# Patient Record
Sex: Female | Born: 1970
Health system: Southern US, Community
[De-identification: ages and names within clinical notes are randomized; demographics above are authoritative.]

## PROBLEM LIST (undated history)

## (undated) DIAGNOSIS — Z800 Family history of malignant neoplasm of digestive organs: Secondary | ICD-10-CM

## (undated) DIAGNOSIS — Z8042 Family history of malignant neoplasm of prostate: Secondary | ICD-10-CM

## (undated) DIAGNOSIS — Z803 Family history of malignant neoplasm of breast: Secondary | ICD-10-CM

## (undated) DIAGNOSIS — C801 Malignant (primary) neoplasm, unspecified: Secondary | ICD-10-CM

## (undated) DIAGNOSIS — K319 Disease of stomach and duodenum, unspecified: Secondary | ICD-10-CM

## (undated) HISTORY — PX: DILATION AND CURETTAGE OF UTERUS: SHX78

## (undated) HISTORY — DX: Family history of malignant neoplasm of prostate: Z80.42

## (undated) HISTORY — DX: Family history of malignant neoplasm of digestive organs: Z80.0

## (undated) HISTORY — DX: Family history of malignant neoplasm of breast: Z80.3

## (undated) HISTORY — PX: OTHER SURGICAL HISTORY: SHX169

---

## 2006-09-07 ENCOUNTER — Inpatient Hospital Stay (HOSPITAL_COMMUNITY): Admission: AD | Admit: 2006-09-07 | Discharge: 2006-09-09 | Payer: Self-pay | Admitting: Obstetrics and Gynecology

## 2006-09-14 ENCOUNTER — Encounter: Admission: RE | Admit: 2006-09-14 | Discharge: 2006-10-13 | Payer: Self-pay | Admitting: Obstetrics and Gynecology

## 2006-10-14 ENCOUNTER — Encounter: Admission: RE | Admit: 2006-10-14 | Discharge: 2006-11-13 | Payer: Self-pay | Admitting: Obstetrics and Gynecology

## 2006-11-14 ENCOUNTER — Encounter: Admission: RE | Admit: 2006-11-14 | Discharge: 2006-11-26 | Payer: Self-pay | Admitting: Obstetrics and Gynecology

## 2007-08-11 ENCOUNTER — Emergency Department (HOSPITAL_COMMUNITY): Admission: EM | Admit: 2007-08-11 | Discharge: 2007-08-11 | Payer: Self-pay | Admitting: Family Medicine

## 2007-09-02 ENCOUNTER — Ambulatory Visit: Payer: Self-pay | Admitting: Internal Medicine

## 2007-10-21 ENCOUNTER — Telehealth: Payer: Self-pay | Admitting: Internal Medicine

## 2008-03-09 ENCOUNTER — Telehealth: Payer: Self-pay | Admitting: Internal Medicine

## 2009-08-02 ENCOUNTER — Ambulatory Visit: Payer: Self-pay | Admitting: Internal Medicine

## 2009-08-02 DIAGNOSIS — L255 Unspecified contact dermatitis due to plants, except food: Secondary | ICD-10-CM

## 2009-08-02 DIAGNOSIS — J069 Acute upper respiratory infection, unspecified: Secondary | ICD-10-CM

## 2010-02-28 ENCOUNTER — Emergency Department (HOSPITAL_COMMUNITY): Admission: EM | Admit: 2010-02-28 | Discharge: 2009-10-03 | Payer: Self-pay | Admitting: Emergency Medicine

## 2010-04-17 ENCOUNTER — Ambulatory Visit
Admission: RE | Admit: 2010-04-17 | Discharge: 2010-04-17 | Payer: Self-pay | Source: Home / Self Care | Attending: Family Medicine | Admitting: Family Medicine

## 2010-04-17 DIAGNOSIS — J029 Acute pharyngitis, unspecified: Secondary | ICD-10-CM | POA: Insufficient documentation

## 2010-04-24 NOTE — Assessment & Plan Note (Signed)
Summary: cough/?poison oak/pt had tick on her 10 day ago/njr   Vital Signs:  Patient profile:   40 year old female Weight:      131 pounds Temp:     98.0 degrees F oral BP sitting:   100 / 60  (right arm) Cuff size:   regular  Vitals Entered By: Duard Brady LPN (Aug 02, 2009 10:13 AM) CC: cough, congestion, posion oak ? on arms and ear , also found embedded tick this weekend. Is Patient Diabetic? No   CC:  cough, congestion, posion oak ? on arms and ear , and also found embedded tick this weekend..  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  She also has a significant contact dermatitis involving the outer aspect of her left lower arm and scattered areas over her right ear and right upper arm.  She also had a tic  exposure  approximately 3 weeks ago.  Allergies (verified): 1)  ! * Sulfa? 2)  ! * Augmentin ?  Past History:  Past Medical History: Reviewed history from 09/02/2007 and no changes required. history of pyelonephritis gravida 6, para 3, abortus 3 family history of early breast cancer  Review of Systems       The patient complains of anorexia, hoarseness, prolonged cough, and suspicious skin lesions.  The patient denies fever, weight loss, weight gain, vision loss, decreased hearing, chest pain, syncope, dyspnea on exertion, peripheral edema, headaches, hemoptysis, abdominal pain, melena, hematochezia, severe indigestion/heartburn, hematuria, incontinence, genital sores, muscle weakness, transient blindness, difficulty walking, depression, unusual weight change, abnormal bleeding, enlarged lymph nodes, angioedema, and breast masses.    Physical Exam  General:  Well-developed,well-nourished,in no acute distress; alert,appropriate and cooperative throughout examination Head:  Normocephalic and atraumatic without obvious abnormalities. No apparent alopecia or  balding. Eyes:  No corneal or conjunctival inflammation noted. EOMI. Perrla. Funduscopic exam benign, without hemorrhages, exudates or papilledema. Vision grossly normal. Ears:  External ear exam shows no significant lesions or deformities.  Otoscopic examination reveals clear canals, tympanic membranes are intact bilaterally without bulging, retraction, inflammation or discharge. Hearing is grossly normal bilaterally. Mouth:  Oral mucosa and oropharynx without lesions or exudates.  Teeth in good repair. Neck:  No deformities, masses, or tenderness noted. Lungs:  Normal respiratory effort, chest expands symmetrically. Lungs are clear to auscultation, no crackles or wheezes. Heart:  Normal rate and regular rhythm. S1 and S2 normal without gallop, murmur, click, rub or other extra sounds. Skin:  significant dermatitis involving her left lower outer arm with erythema , vesiculation   Impression & Recommendations:  Problem # 1:  URI (ICD-465.9)  Problem # 2:  RHUS DERMATITIS (ICD-692.6)  Her updated medication list for this problem includes:    Triamcinolone Acetonide 0.5 % Crea (Triamcinolone acetonide) .Marland Kitchen... Apply twice daily  Her updated medication list for this problem includes:    Triamcinolone Acetonide 0.5 % Crea (Triamcinolone acetonide) .Marland Kitchen... Apply twice daily  Orders: Depo- Medrol 80mg  (J1040) Admin of Therapeutic Inj  intramuscular or subcutaneous (16109)  Problem # 3:  TICK BITE (ICD-E906.4) if patient develops fever.  Will treat with a 10 day course of doxycycline  Complete Medication List: 1)  Triamcinolone Acetonide 0.5 % Crea (Triamcinolone acetonide) .... Apply twice daily  Patient Instructions: 1)  Get plenty of rest, drink lots of clear liquids, and use Tylenol or Ibuprofen for fever and comfort. Return in 7-10 days if you're not better:sooner if you're  feeling worse. Prescriptions: TRIAMCINOLONE ACETONIDE 0.5 % CREA (TRIAMCINOLONE ACETONIDE) apply twice daily  #30  gm x 1   Entered and Authorized by:   Gordy Savers  MD   Signed by:   Gordy Savers  MD on 08/02/2009   Method used:   Print then Give to Patient   RxID:   [id number]    Medication Administration  Injection # 1:    Medication: Depo- Medrol 80mg     Diagnosis: RHUS DERMATITIS (ICD-692.6)    Route: IM    Site: RUOQ gluteus    Exp Date: 01/2012    Lot #: [id number]    Mfr: Pharmacia    Patient tolerated injection without complications    Given by: Duard Brady LPN (Aug 02, 2009 11:27 AM)  Orders Added: 1)  Est. Patient Level III [14782] 2)  Depo- Medrol 80mg  [J1040] 3)  Admin of Therapeutic Inj  intramuscular or subcutaneous [95621]

## 2010-04-25 NOTE — Assessment & Plan Note (Signed)
Summary: ? strep//ccm   Vital Signs:  Patient profile:   40 year old female Temp:     98.7 degrees F oral BP sitting:   80 / 60  (left arm) Cuff size:   regular  Vitals Entered By: Sid Falcon LPN (April 17, 2010 1:41 PM)   History of Present Illness: Patient seen with some nasal congestion and cough for the past few days. Last night developed sore throat. Was treated first of this month for positive strep screen with Zithromax. Felt better initially afterwards. No sick contacts. No rash. Denies nausea, vomiting, or diarrhea  Allergies: 1)  ! * Sulfa? 2)  ! * Augmentin ?  Past History:  Past Medical History: Last updated: 09/02/2007 history of pyelonephritis gravida 6, para 3, abortus 3 family history of early breast cancer PMH-FH-SH reviewed for relevance  Review of Systems      See HPI  Physical Exam  General:  Well-developed,well-nourished,in no acute distress; alert,appropriate and cooperative throughout examination Ears:  External ear exam shows no significant lesions or deformities.  Otoscopic examination reveals clear canals, tympanic membranes are intact bilaterally without bulging, retraction, inflammation or discharge. Hearing is grossly normal bilaterally. Mouth:  minimal erythema without exudate Neck:  supple with tender anterior cervical nodes right greater than left Lungs:  Normal respiratory effort, chest expands symmetrically. Lungs are clear to auscultation, no crackles or wheezes. Heart:  Normal rate and regular rhythm. S1 and S2 normal without gallop, murmur, click, rub or other extra sounds. Skin:  Intact without suspicious lesions or rashes   Impression & Recommendations:  Problem # 1:  ACUTE PHARYNGITIS (ICD-462)  check rapid strep. If negative treat symptomatically  Orders: Rapid Strep (16109)  Complete Medication List: 1)  Triamcinolone Acetonide 0.5 % Crea (Triamcinolone acetonide) .... Apply twice daily   Orders Added: 1)  Rapid  Strep [87880] 2)  Est. Patient Level III [60454]

## 2010-04-29 ENCOUNTER — Telehealth: Payer: Self-pay | Admitting: *Deleted

## 2010-04-29 NOTE — Telephone Encounter (Signed)
Follow up with primary if still swollen by end of week.

## 2010-04-29 NOTE — Telephone Encounter (Signed)
Pt wants to know her lymph nodes are still swollen, and if she needs to be concerned.

## 2010-04-29 NOTE — Telephone Encounter (Signed)
Left message on pt's personal voice mail per Dr. Lucie Leather recommendations.

## 2010-05-02 ENCOUNTER — Encounter: Payer: Self-pay | Admitting: Internal Medicine

## 2010-05-02 ENCOUNTER — Ambulatory Visit (INDEPENDENT_AMBULATORY_CARE_PROVIDER_SITE_OTHER): Payer: Managed Care, Other (non HMO) | Admitting: Internal Medicine

## 2010-05-02 DIAGNOSIS — R599 Enlarged lymph nodes, unspecified: Secondary | ICD-10-CM

## 2010-05-02 DIAGNOSIS — R590 Localized enlarged lymph nodes: Secondary | ICD-10-CM

## 2010-05-02 DIAGNOSIS — J029 Acute pharyngitis, unspecified: Secondary | ICD-10-CM

## 2010-05-02 NOTE — Progress Notes (Signed)
  Subjective:    Patient ID: Stephanie Johnson, female    DOB: May 12, 1970, 40 y.o.   MRN: [id number]  HPI  40 year old patient who has a history of recent documented streptococcal pharyngitis treated with azithromycin.  Her sore throat has resolved, but she has residual cervical adenopathy.  That caused her some concern.  She feels well without constitutional complaints.  No fever, sore throat has resolved.  She has noted.  No other adenopathy.    Review of Systems  Constitutional: Negative.   HENT: Negative for hearing loss, congestion, sore throat, rhinorrhea, dental problem, sinus pressure and tinnitus.   Eyes: Negative for pain, discharge and visual disturbance.  Respiratory: Negative for cough and shortness of breath.   Cardiovascular: Negative for chest pain, palpitations and leg swelling.  Gastrointestinal: Negative for nausea, vomiting, abdominal pain, diarrhea, constipation, blood in stool and abdominal distention.  Genitourinary: Negative for dysuria, urgency, frequency, hematuria, flank pain, vaginal bleeding, vaginal discharge, difficulty urinating, vaginal pain and pelvic pain.  Musculoskeletal: Negative for joint swelling, arthralgias and gait problem.  Skin: Negative for rash.  Neurological: Negative for dizziness, syncope, speech difficulty, weakness, numbness and headaches.  Hematological: Positive for adenopathy. Does not bruise/bleed easily.  Psychiatric/Behavioral: Negative for behavioral problems, dysphoric mood and agitation. The patient is not nervous/anxious.        Objective:   Physical Exam  Constitutional: She is oriented to person, place, and time. She appears well-developed and well-nourished.  HENT:  Head: Normocephalic and atraumatic.  Right Ear: External ear normal.  Left Ear: External ear normal.  Nose: Nose normal.  Mouth/Throat: Oropharynx is clear and moist.  Eyes: Conjunctivae and EOM are normal. Pupils are equal, round, and reactive to light.  Neck:  Normal range of motion. Neck supple. No thyromegaly present.       Patient had bilateral cervical adenopathy.  The right more prominent than the left.  The patient had no generalized lymphadenopathy  Cardiovascular: Normal rate, regular rhythm, normal heart sounds and intact distal pulses.   Pulmonary/Chest: Effort normal and breath sounds normal.  Abdominal: Soft. Bowel sounds are normal. She exhibits no mass. There is no tenderness.       No hepato- splenomegaly  Musculoskeletal: Normal range of motion.  Lymphadenopathy:    She has cervical adenopathy.  Neurological: She is alert and oriented to person, place, and time.  Skin: Skin is warm and dry. No rash noted.  Psychiatric: She has a normal mood and affect. Her behavior is normal.          Assessment & Plan:  Cervical lymphadenopathy.  This appears to be post inflammatory following recent group A beta-hemolytic strep.  Further antibiotic therapy not indicated.  She did have a office visit and negative Follow-up rapid strep recently.  She was reassured.  She will call if lymphadenopathy persists or worsens

## 2010-05-02 NOTE — Patient Instructions (Signed)
Call or return to clinic prn if these symptoms worsen or fail to improve as anticipated.

## 2010-06-26 ENCOUNTER — Telehealth: Payer: Self-pay | Admitting: *Deleted

## 2010-06-26 MED ORDER — CEPHALEXIN 500 MG PO CAPS: 500.0000 mg | ORAL_CAPSULE | Freq: Four times a day (QID) | ORAL | Status: AC

## 2010-06-26 NOTE — Telephone Encounter (Signed)
Cephalexin called in for pt's cat bite.

## 2010-06-26 NOTE — Telephone Encounter (Addendum)
Pt was bitten by a cat on her finger and needs antibiotics.  Augmentin upsets her stomach.  Has Cephexlexin in the office if she can start it.  Wants to start by lunch.  She is a Administrator, [employer].  Per Dr. Kirtland Bouchard :  Call in Cephalexin 500 mg. One po qid x 10 days.  Pt notified.

## 2010-08-06 NOTE — Discharge Summary (Signed)
NAMEJAMI, BOGDANSKI                  ACCOUNT NO.:  [id number]   MEDICAL RECORD NO.:  [id number]          PATIENT TYPE:  INP   LOCATION:  9109                          FACILITY:  [facility]   PHYSICIAN:  Zenaida Niece, M.D.DATE OF BIRTH:  Dec 23, 1970   DATE OF ADMISSION:  09/07/2006  DATE OF DISCHARGE:  09/09/2006                               DISCHARGE SUMMARY   ADMISSION DIAGNOSIS:  Intrauterine pregnancy at 37 weeks.  Group B strep  carrier.   DISCHARGE DIAGNOSIS:  Intrauterine pregnancy at 37 weeks.  Group B strep  carrier.   PROCEDURES:  On June 16, she had a spontaneous vaginal delivery.   HISTORY AND PHYSICAL:  This is a 40 year old white female gravida 6,  para 2-0-3-2 with an EGA of [redacted] weeks who presents with complaint of  ruptured membranes at 1600 on the day of admission.  This was confirmed  in the office and she at that point she was 2-3, 80,  -2, vertex present  presentation.  Prenatal care was essentially uncomplicated.   PRENATAL LABS:  Blood type is O+ with negative antibody screen, RPR  nonreactive, rubella immune, hepatitis B surface antigen negative,  gonorrhea and chlamydia negative, 1-hour Glucola 105 and group B strep  is positive.   PAST OB HISTORY:  Two vaginal deliveries at term 7 pounds 11 ounces, 6  pounds 4 ounces and the second delivery was quick. She has had three  spontaneous abortions.   PAST MEDICAL HISTORY:  Pyelonephritis.   PAST SURGICAL HISTORY:  D&C x2.   ALLERGIES:  AUGMENTIN has caused emesis and questionable reaction to  SULFA. She has tolerated IV PENICILLIN before.   PHYSICAL EXAM:  VITAL SIGNS:  She is afebrile with stable vital signs.  Fetal heart tracing is reactive with contractions every 5-8 minutes.  ABDOMEN:  Gravid and nontender with an estimated fetal weight of 7  pounds.  PELVIC:  Cervix is 2-3, 70, -2, vertex presentation, adequate pelvis.   HOSPITAL COURSE:  The patient was admitted and was given IV penicillin.  Her  contractions did not pick up so after she had received the  penicillin for at least an hour she was started on low-dose Pitocin.  Once she entered active labor, she progressed quickly to complete,  pushed well and on the evening of June 16 had a vaginal delivery of a  viable female infant with Apgars of 9 and 9, weight 6 pounds 2 ounces.  Placenta delivered spontaneous and intact.  It was sent for cord blood  collection.  She had a small first-degree laceration repaired with 3-0  Vicryl with local block.  Estimated blood loss was less than 500 mL.  Postpartum she had no significant complications.  Pre delivery  hemoglobin was 12, postdelivery is 11.  On postpartum day #2 she was  felt to be stable enough for discharge home.   DISCHARGE INSTRUCTIONS:  Regular diet, pelvic rest, follow-up in 6  weeks.  Medications are over-the-counter ibuprofen as needed and she is  given our discharge pamphlet.      Zenaida Niece, M.D.  Electronically Signed     TDM/MEDQ  D:  09/09/2006  T:  09/09/2006  Job:  [id number]

## 2011-01-08 LAB — CBC
HCT: 33.2 — ABNORMAL LOW
MCHC: 34.3
MCV: 91.6
Platelets: 148 — ABNORMAL LOW
Platelets: 179
RBC: 3.83 — ABNORMAL LOW
RDW: 14.6 — ABNORMAL HIGH

## 2011-01-08 LAB — RPR: RPR Ser Ql: NONREACTIVE

## 2012-05-11 ENCOUNTER — Ambulatory Visit (INDEPENDENT_AMBULATORY_CARE_PROVIDER_SITE_OTHER): Payer: BC Managed Care – PPO | Admitting: Family Medicine

## 2012-05-11 ENCOUNTER — Encounter: Payer: Self-pay | Admitting: Family Medicine

## 2012-05-11 VITALS — BP 120/74 | HR 108 | Temp 98.3°F | Wt 148.0 lb

## 2012-05-11 DIAGNOSIS — J329 Chronic sinusitis, unspecified: Secondary | ICD-10-CM

## 2012-05-11 DIAGNOSIS — J309 Allergic rhinitis, unspecified: Secondary | ICD-10-CM

## 2012-05-11 MED ORDER — FLUTICASONE PROPIONATE 50 MCG/ACT NA SUSP: 2.0000 | Freq: Every day | NASAL | Status: DC

## 2012-05-11 MED ORDER — AZITHROMYCIN 250 MG PO TABS: ORAL_TABLET | ORAL | Status: DC

## 2012-05-11 MED ORDER — HYDROCODONE-HOMATROPINE 5-1.5 MG/5ML PO SYRP: 5.0000 mL | ORAL_SOLUTION | Freq: Every evening | ORAL | Status: DC | PRN

## 2012-05-11 NOTE — Progress Notes (Signed)
Chief Complaint  Patient presents with  . Cough    hurting in chest and throat; keeps pt up at night     HPI:  -started: started with a stomach bug and a cold for a few weeks, but that cleared up but cough has lingered -symptoms:nasal congestion, sore throat, drainage - these have resolved but has lingering cough and drainage, has had some sinus congestion -denies:fever, SOB, NVD, tooth pain  -has tried: nothing -sick contacts: family a few weeks ago   Hx of: AR -nasal symptoms, PND and sneezing for several months every spring -syrtec helps  ROS: See pertinent positives and negatives per HPI.  No past medical history on file.  Family History  Problem Relation Age of Onset  . Cancer Mother 36    stage IV  . Cancer Father     prostate  . Hypertension Father   . Stroke Father   . Migraines Brother   . Cancer Maternal Grandmother     breast   . Heart failure Paternal Grandfather     congestive    History   Social History  . Marital Status: Married    Spouse Name: N/A    Number of Children: N/A  . Years of Education: N/A   Social History Main Topics  . Smoking status: Never Smoker   . Smokeless tobacco: None  . Alcohol Use: None  . Drug Use: None  . Sexually Active: None   Other Topics Concern  . None   Social History Narrative  . None    Current outpatient prescriptions:azithromycin (ZITHROMAX) 250 MG tablet, 2 tabs on the fist day, 1 tab daily for 4 days, Disp: 6 tablet, Rfl: 0;  fluticasone (FLONASE) 50 MCG/ACT nasal spray, Place 2 sprays into the nose daily., Disp: 16 g, Rfl: 2;  HYDROcodone-homatropine (HYCODAN) 5-1.5 MG/5ML syrup, Take 5 mLs by mouth at bedtime as needed for cough., Disp: 120 mL, Rfl: 0 triamcinolone (KENALOG) 0.5 % cream, Apply topically 2 (two) times daily.  , Disp: , Rfl:   EXAM:  Filed Vitals:   05/11/12 1030  BP: 120/74  Pulse: 108  Temp: 98.3 F (36.8 C)    Body mass index is 25.8 kg/(m^2).  GENERAL: vitals reviewed  and listed above, alert, oriented, appears well hydrated and in no acute distress  HEENT: atraumatic, conjunttiva clear, no obvious abnormalities on inspection of external nose and ears, normal appearance of ear canals and TMs, white nasal congestion L>R, mild post oropharyngeal erythema with PND, no tonsillar edema or exudate, no sinus TTP  NECK: no obvious masses on inspection  LUNGS: clear to auscultation bilaterally, no wheezes, rales or rhonchi, good air movement  CV: HRRR, no peripheral edema  MS: moves all extremities without noticeable abnormality  PSYCH: pleasant and cooperative, no obvious depression or anxiety  ASSESSMENT AND PLAN:  Discussed the following assessment and plan:  Sinusitis - Plan: azithromycin (ZITHROMAX) 250 MG tablet, HYDROcodone-homatropine (HYCODAN) 5-1.5 MG/5ML syrup  Allergic rhinitis - Plan: fluticasone (FLONASE) 50 MCG/ACT nasal spray  -per exam and length of symptoms abx and cough medication provided - risks discussed.  -for what sounds like seasonal AR - advised flonase and addition of zyrtec if needed -Patient advised to return or notify a doctor immediately if symptoms worsen or persist or new concerns arise.  Patient Instructions  INSTRUCTIONS FOR UPPER RESPIRATORY INFECTION:  -As we discussed, we have prescribed new medications (AZITHROMYCIN, FLONASE and HYCODAN) for you at this appointment. We discussed the common and serious  potential adverse effects of this medication and you can review these and more with the pharmacist when you pick up your medication.  Please follow the instructions for use carefully and notify us immediately if you have any problems taking this medication.   -plenty of rest and fluids  -nasal saline wash 2-3 times daily (use prepackaged nasal saline or bottled/distilled water if making your own)   -can use tylenol or ibuprofen as directed for aches and sorethroat  -in the winter time, using a humidifier at night is  helpful (please follow cleaning instructions)  -if you are taking a cough medication - use only as directed, may also try a teaspoon of honey to coat the throat and throat lozenges  -for sore throat, salt water gargles can help  -follow up if you have fevers, facial pain, tooth pain, difficulty breathing or are worsening or not getting better in 5-7 days      Alvia Jablonski R.

## 2012-05-11 NOTE — Patient Instructions (Addendum)
INSTRUCTIONS FOR UPPER RESPIRATORY INFECTION:  -As we discussed, we have prescribed new medications (AZITHROMYCIN, FLONASE and HYCODAN) for you at this appointment. We discussed the common and serious potential adverse effects of this medication and you can review these and more with the pharmacist when you pick up your medication.  Please follow the instructions for use carefully and notify us immediately if you have any problems taking this medication.   -plenty of rest and fluids  -nasal saline wash 2-3 times daily (use prepackaged nasal saline or bottled/distilled water if making your own)   -can use tylenol or ibuprofen as directed for aches and sorethroat  -in the winter time, using a humidifier at night is helpful (please follow cleaning instructions)  -if you are taking a cough medication - use only as directed, may also try a teaspoon of honey to coat the throat and throat lozenges  -for sore throat, salt water gargles can help  -follow up if you have fevers, facial pain, tooth pain, difficulty breathing or are worsening or not getting better in 5-7 days

## 2013-02-01 ENCOUNTER — Encounter (HOSPITAL_COMMUNITY): Payer: Self-pay | Admitting: *Deleted

## 2013-03-07 ENCOUNTER — Encounter (HOSPITAL_COMMUNITY): Payer: Self-pay | Admitting: Pharmacist

## 2013-03-08 ENCOUNTER — Ambulatory Visit (HOSPITAL_COMMUNITY)
Admission: RE | Admit: 2013-03-08 | Discharge: 2013-03-08 | Disposition: A | Discharge status: Home / Self Care | Payer: BC Managed Care – PPO | Source: Ambulatory Visit | Attending: Obstetrics and Gynecology | Admitting: Obstetrics and Gynecology

## 2013-03-08 ENCOUNTER — Encounter (HOSPITAL_COMMUNITY): Payer: Self-pay | Admitting: Anesthesiology

## 2013-03-08 ENCOUNTER — Ambulatory Visit (HOSPITAL_COMMUNITY): Payer: BC Managed Care – PPO | Admitting: Anesthesiology

## 2013-03-08 ENCOUNTER — Encounter (HOSPITAL_COMMUNITY)
Admission: RE | Disposition: A | Discharge status: Home / Self Care | Payer: Self-pay | Source: Ambulatory Visit | Attending: Obstetrics and Gynecology

## 2013-03-08 ENCOUNTER — Encounter (HOSPITAL_COMMUNITY): Payer: BC Managed Care – PPO | Admitting: Anesthesiology

## 2013-03-08 DIAGNOSIS — D069 Carcinoma in situ of cervix, unspecified: Secondary | ICD-10-CM | POA: Diagnosis present

## 2013-03-08 HISTORY — PX: CERVICAL CONIZATION W/BX: SHX1330

## 2013-03-08 HISTORY — DX: Carcinoma in situ of cervix, unspecified: D06.9

## 2013-03-08 LAB — CBC
HCT: 37.6 % (ref 36.0–46.0)
MCH: 30.5 pg (ref 26.0–34.0)
MCHC: 34 g/dL (ref 30.0–36.0)
RDW: 12.6 % (ref 11.5–15.5)

## 2013-03-08 LAB — PREGNANCY, URINE: Preg Test, Ur: NEGATIVE

## 2013-03-08 SURGERY — CONE BIOPSY, CERVIX
Anesthesia: General | Site: Vagina

## 2013-03-08 MED ORDER — LIDOCAINE HCL (CARDIAC) 20 MG/ML IV SOLN
INTRAVENOUS | Status: DC | PRN
  Administered 2013-03-08: 30 mg via INTRAVENOUS

## 2013-03-08 MED ORDER — ONDANSETRON HCL 4 MG/2ML IJ SOLN
INTRAMUSCULAR | Status: AC
  Filled 2013-03-08: qty 2

## 2013-03-08 MED ORDER — KETOROLAC TROMETHAMINE 30 MG/ML IJ SOLN
INTRAMUSCULAR | Status: AC
  Filled 2013-03-08: qty 1

## 2013-03-08 MED ORDER — FLUMAZENIL 0.5 MG/5ML IV SOLN
INTRAVENOUS | Status: DC | PRN
  Administered 2013-03-08: .15 mg via INTRAVENOUS

## 2013-03-08 MED ORDER — MIDAZOLAM HCL 2 MG/2ML IJ SOLN
INTRAMUSCULAR | Status: AC
  Filled 2013-03-08: qty 2

## 2013-03-08 MED ORDER — IODINE STRONG (LUGOLS) 5 % PO SOLN
ORAL | Status: AC
  Filled 2013-03-08: qty 1

## 2013-03-08 MED ORDER — HYDROCODONE-ACETAMINOPHEN 5-325 MG PO TABS: 1.0000 | ORAL_TABLET | Freq: Four times a day (QID) | ORAL | Status: DC | PRN

## 2013-03-08 MED ORDER — LIDOCAINE HCL 1 % IJ SOLN
INTRAMUSCULAR | Status: AC
  Filled 2013-03-08: qty 20

## 2013-03-08 MED ORDER — ACETAMINOPHEN 160 MG/5ML PO SOLN
1000.0000 mg | Freq: Once | ORAL | Status: AC
  Administered 2013-03-08: 1000 mg via ORAL

## 2013-03-08 MED ORDER — FLUMAZENIL 0.5 MG/5ML IV SOLN
INTRAVENOUS | Status: AC
  Filled 2013-03-08: qty 5

## 2013-03-08 MED ORDER — PROPOFOL 10 MG/ML IV BOLUS
INTRAVENOUS | Status: DC | PRN
  Administered 2013-03-08: 150 mg via INTRAVENOUS

## 2013-03-08 MED ORDER — FENTANYL CITRATE 0.05 MG/ML IJ SOLN: 25.0000 ug | INTRAMUSCULAR | Status: DC | PRN

## 2013-03-08 MED ORDER — LACTATED RINGERS IV SOLN
INTRAVENOUS | Status: DC
  Administered 2013-03-08: 11:00:00 via INTRAVENOUS

## 2013-03-08 MED ORDER — ARTIFICIAL TEARS OP OINT
TOPICAL_OINTMENT | OPHTHALMIC | Status: AC
  Filled 2013-03-08: qty 3.5

## 2013-03-08 MED ORDER — ONDANSETRON HCL 4 MG/2ML IJ SOLN
INTRAMUSCULAR | Status: DC | PRN
  Administered 2013-03-08: 4 mg via INTRAVENOUS

## 2013-03-08 MED ORDER — LACTATED RINGERS IV SOLN: INTRAVENOUS | Status: DC

## 2013-03-08 MED ORDER — LIDOCAINE HCL 1 % IJ SOLN
INTRAMUSCULAR | Status: DC | PRN
  Administered 2013-03-08: 16 mL

## 2013-03-08 MED ORDER — LIDOCAINE HCL (CARDIAC) 20 MG/ML IV SOLN
INTRAVENOUS | Status: AC
  Filled 2013-03-08: qty 5

## 2013-03-08 MED ORDER — FENTANYL CITRATE 0.05 MG/ML IJ SOLN
INTRAMUSCULAR | Status: DC | PRN
  Administered 2013-03-08: 50 ug via INTRAVENOUS

## 2013-03-08 MED ORDER — ACETAMINOPHEN 160 MG/5ML PO SOLN
ORAL | Status: AC
  Administered 2013-03-08: 1000 mg via ORAL
  Filled 2013-03-08: qty 40.6

## 2013-03-08 MED ORDER — ACETIC ACID 5 % SOLN
Status: AC
  Filled 2013-03-08: qty 500

## 2013-03-08 MED ORDER — KETOROLAC TROMETHAMINE 30 MG/ML IJ SOLN
INTRAMUSCULAR | Status: DC | PRN
  Administered 2013-03-08: 30 mg via INTRAVENOUS

## 2013-03-08 MED ORDER — PROPOFOL 10 MG/ML IV EMUL
INTRAVENOUS | Status: AC
  Filled 2013-03-08: qty 20

## 2013-03-08 MED ORDER — FERRIC SUBSULFATE 259 MG/GM EX SOLN
CUTANEOUS | Status: AC
  Filled 2013-03-08: qty 8

## 2013-03-08 MED ORDER — FENTANYL CITRATE 0.05 MG/ML IJ SOLN
INTRAMUSCULAR | Status: AC
  Filled 2013-03-08: qty 2

## 2013-03-08 MED ORDER — MIDAZOLAM HCL 2 MG/2ML IJ SOLN
INTRAMUSCULAR | Status: DC | PRN
  Administered 2013-03-08: 2 mg via INTRAVENOUS

## 2013-03-08 MED ORDER — IODINE STRONG (LUGOLS) 5 % PO SOLN
ORAL | Status: DC | PRN
  Administered 2013-03-08: 0.2 mL

## 2013-03-08 SURGICAL SUPPLY — 39 items
APPLICATOR COTTON TIP 6IN STRL (MISCELLANEOUS) ×2 IMPLANT
BLADE SURG 15 STRL LF C SS BP (BLADE) ×1 IMPLANT
BLADE SURG 15 STRL SS (BLADE) ×2
CATH ROBINSON RED A/P 16FR (CATHETERS) ×2 IMPLANT
CLOTH BEACON ORANGE TIMEOUT ST (SAFETY) ×2 IMPLANT
CONTAINER PREFILL 10% NBF 60ML (FORM) ×2 IMPLANT
COUNTER NEEDLE 1200 MAGNETIC (NEEDLE) ×2 IMPLANT
DECANTER SPIKE VIAL GLASS SM (MISCELLANEOUS) ×2 IMPLANT
DRSG TELFA 3X8 NADH (GAUZE/BANDAGES/DRESSINGS) ×2 IMPLANT
ELECT BALL LEEP 5MM RED (ELECTRODE) ×2 IMPLANT
ELECT REM PT RETURN 9FT ADLT (ELECTROSURGICAL) ×2
ELECTRODE REM PT RTRN 9FT ADLT (ELECTROSURGICAL) ×1 IMPLANT
GAUZE SPONGE 4X4 16PLY XRAY LF (GAUZE/BANDAGES/DRESSINGS) IMPLANT
GLOVE BIO SURGEON STRL SZ8 (GLOVE) ×2 IMPLANT
GLOVE ORTHO TXT STRL SZ7.5 (GLOVE) ×2 IMPLANT
GOWN STRL REIN XL XLG (GOWN DISPOSABLE) ×4 IMPLANT
HEMOSTAT SURGICEL 2X14 (HEMOSTASIS) IMPLANT
HEMOSTAT SURGICEL 2X3 (HEMOSTASIS) ×1 IMPLANT
HOSE NS SMOKE EVAC 7/8 X6 (MISCELLANEOUS) ×2 IMPLANT
NDL SPNL 22GX3.5 QUINCKE BK (NEEDLE) ×1 IMPLANT
NEEDLE SPNL 22GX3.5 QUINCKE BK (NEEDLE) ×2 IMPLANT
NS IRRIG 1000ML POUR BTL (IV SOLUTION) ×2 IMPLANT
PACK VAGINAL MINOR WOMEN LF (CUSTOM PROCEDURE TRAY) ×2 IMPLANT
PAD DRESSING TELFA 3X8 NADH (GAUZE/BANDAGES/DRESSINGS) ×1 IMPLANT
PAD OB MATERNITY 4.3X12.25 (PERSONAL CARE ITEMS) ×2 IMPLANT
PENCIL BUTTON HOLSTER BLD 10FT (ELECTRODE) ×2 IMPLANT
REDUCER FITTING SMOKE EVAC (MISCELLANEOUS) ×2 IMPLANT
SCOPETTES 8  STERILE (MISCELLANEOUS) ×2
SCOPETTES 8 STERILE (MISCELLANEOUS) ×2 IMPLANT
SPONGE SURGIFOAM ABS GEL 12-7 (HEMOSTASIS) IMPLANT
SUT CHROMIC 1 CT1 27 (SUTURE) ×4 IMPLANT
SYR 20CC LL (SYRINGE) ×2 IMPLANT
SYR CONTROL 10ML LL (SYRINGE) ×2 IMPLANT
SYR TB 1ML 27GX1/2 SAFE (SYRINGE) ×1 IMPLANT
SYR TB 1ML 27GX1/2 SAFETY (SYRINGE) ×2
TOWEL OR 17X24 6PK STRL BLUE (TOWEL DISPOSABLE) ×4 IMPLANT
TUBING NON-CON 1/4 X 20 CONN (TUBING) ×2 IMPLANT
WATER STERILE IRR 1000ML POUR (IV SOLUTION) ×2 IMPLANT
YANKAUER SUCT BULB TIP NO VENT (SUCTIONS) ×2 IMPLANT

## 2013-03-08 NOTE — Interval H&P Note (Signed)
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 After consideration of risks, benefits and other options for treatment, the patient has consented to  Procedure(s) with comments: CONIZATION CERVIX WITH BIOPSY, cold knife cone biopsy  (N/A) - 1hr OR time as a surgical intervention .  The patient's history has been reviewed, patient examined, no change in status, stable for surgery.  I have reviewed the patient's chart and labs.  Questions were answered to the patient's satisfaction.     Stephanie Johnson D

## 2013-03-08 NOTE — Op Note (Signed)
Preoperative diagnosis: CIN-3 Postoperative diagnosis: Same Procedure: Cold knife cone biopsy Surgeon: Lavina Hamman M.D. Anesthesia: General with LMA Estimated blood loss: 50 cc Findings: She had a well-healed LEEP bed, the entire ectocervix stained with Lugol's solution Complications: None Specimens: Cone biopsy   Procedure in detail:  The patient was taken to the operating room placed in the dorsosupine position.  General anesthesia was induced and she was placed in mobile stirrups. Perineum and vagina were then prepped and draped in the usual sterile fashion and the legs were elevated in stirrups. Bladder was drained with a red Robinson catheter. A weighted speculum was inserted into the vagina and Deaver retractors used anteriorly and laterally. Stay sutures of #0 chromic were placed at 3 and 9:00. Deep paracervical block was then performed with a total of 16 cc of 1% plain lidocaine. The cervix was then stained with Lugol's solution and there were no nonstaining areas. The previous LEEP bed was well demarcated. I then used a scalpel to remove a cone biopsy specimen out well lateral of any ectocervical margins and took a deep endocervical margin. The specimen was removed intact, keeping her IUD strings intact. Bleeding was controlled with electrocautery. The cone bed was then packed with Surgicel and the stay sutures were tied across the cervix. No significant bleeding was noted. All instruments were removed from the vagina. The patient was taken down from stirrups and taken to recovery in stable condition. Counts were correct and she had PAS hose on throughout the procedure.

## 2013-03-08 NOTE — Anesthesia Preprocedure Evaluation (Signed)
Anesthesia Evaluation  Patient identified by MRN, date of birth, ID band Patient awake    Reviewed: Allergy & Precautions, H&P , Patient's Chart, lab work & pertinent test results, reviewed documented beta blocker date and time   Airway Mallampati: II TM Distance: >3 FB Neck ROM: full    Dental no notable dental hx.    Pulmonary  breath sounds clear to auscultation  Pulmonary exam normal       Cardiovascular Rhythm:regular Rate:Normal     Neuro/Psych    GI/Hepatic   Endo/Other    Renal/GU      Musculoskeletal   Abdominal   Peds  Hematology   Anesthesia Other Findings   Reproductive/Obstetrics                           Anesthesia Physical Anesthesia Plan  ASA: II  Anesthesia Plan:    Post-op Pain Management:    Induction: Intravenous  Airway Management Planned: LMA  Additional Equipment:   Intra-op Plan:   Post-operative Plan:   Informed Consent: I have reviewed the patients History and Physical, chart, labs and discussed the procedure including the risks, benefits and alternatives for the proposed anesthesia with the patient or authorized representative who has indicated his/her understanding and acceptance.   Dental Advisory Given and Dental advisory given  Plan Discussed with: CRNA and Surgeon  Anesthesia Plan Comments:         Anesthesia Quick Evaluation  

## 2013-03-08 NOTE — Anesthesia Postprocedure Evaluation (Signed)
  Anesthesia Post-op Note  Patient: Stephanie Johnson  Procedure(s) Performed: Procedure(s) with comments: CONIZATION CERVIX WITH BIOPSY  (N/A) - 1hr OR time Patient is awake and responsive. Pain and nausea are reasonably well controlled. Vital signs are stable and clinically acceptable. Oxygen saturation is clinically acceptable. There are no apparent anesthetic complications at this time. Patient is ready for discharge.

## 2013-03-08 NOTE — H&P (Signed)
Stephanie Johnson is an 42 y.o. female. 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  She is admitted for cone biopsy for further treatment.  Pertinent Gynecological History: OB History: G6, P3033   Menstrual History: No LMP recorded. Patient has had an implant.    Past Medical History  Diagnosis Date  . Medical history non-contributory   Pyelonephritis  Past Surgical History  Procedure Laterality Date  . Dilation and curettage of uterus      Family History  Problem Relation Age of Onset  . Cancer Mother 49    stage IV  . Cancer Father     prostate  . Hypertension Father   . Stroke Father   . Migraines Brother   . Cancer Maternal Grandmother     breast   . Heart failure Paternal Grandfather     congestive    Social History:  reports that she has never smoked. She does not have any smokeless tobacco history on file. Her alcohol and drug histories are not on file.  Allergies:  Allergies  Allergen Reactions  . Augmentin [Amoxicillin-Pot Clavulanate] Nausea And Vomiting    Can take Keflex    No prescriptions prior to admission    Review of Systems  Respiratory: Negative.   Cardiovascular: Negative.   Gastrointestinal: Negative.   Genitourinary: Negative.     There were no vitals taken for this visit. Physical Exam  Constitutional: She appears well-developed and well-nourished.  Neck: Neck supple. No thyromegaly present.  Cardiovascular: Normal rate, regular rhythm and normal heart sounds.   No murmur heard. Respiratory: Effort normal and breath sounds normal. No respiratory distress. She has no wheezes.  GI: Soft. She exhibits no distension and no mass. There is no tenderness.  Genitourinary: Vagina normal and uterus normal.  No adnexal mass Cervix well healed from LEEP    No results found for this or any previous visit (from the past 24 hour(s)).  No results  found.  Assessment/Plan: CIN III with + margins on LEEP admitted for cone biopsy fur further treatment  Stephanie Johnson D 03/08/2013, 8:07 AM

## 2013-03-08 NOTE — Transfer of Care (Signed)
Immediate Anesthesia Transfer of Care Note  Patient: Stephanie Johnson  Procedure(s) Performed: Procedure(s) with comments: CONIZATION CERVIX WITH BIOPSY  (N/A) - 1hr OR time  Patient Location: PACU  Anesthesia Type:General  Level of Consciousness: awake, oriented, sedated and patient cooperative  Airway & Oxygen Therapy: Patient Spontanous Breathing and Patient connected to nasal cannula oxygen  Post-op Assessment: Report given to PACU RN and Post -op Vital signs reviewed and stable  Post vital signs: Reviewed and stable  Complications: No apparent anesthesia complications

## 2013-03-09 ENCOUNTER — Encounter (HOSPITAL_COMMUNITY): Payer: Self-pay | Admitting: Obstetrics and Gynecology

## 2013-03-29 ENCOUNTER — Encounter: Payer: Self-pay | Admitting: Gynecologic Oncology

## 2013-03-29 ENCOUNTER — Ambulatory Visit: Payer: BC Managed Care – PPO | Attending: Gynecologic Oncology | Admitting: Gynecologic Oncology

## 2013-03-29 VITALS — BP 116/63 | HR 92 | Temp 98.0°F | Resp 20 | Ht 63.78 in | Wt 147.7 lb

## 2013-03-29 DIAGNOSIS — C539 Malignant neoplasm of cervix uteri, unspecified: Secondary | ICD-10-CM | POA: Insufficient documentation

## 2013-03-29 DIAGNOSIS — Z803 Family history of malignant neoplasm of breast: Secondary | ICD-10-CM | POA: Insufficient documentation

## 2013-03-29 DIAGNOSIS — D069 Carcinoma in situ of cervix, unspecified: Secondary | ICD-10-CM

## 2013-03-29 NOTE — Patient Instructions (Signed)
Cervical Cancer The cervix is the opening and bottom part of the uterus between the vagina and the uterus. Cervical cancer is a fairly common cancer. It occurs most often in women between the ages of 40 years and 55 years. Cells of the cervix act very much like skin cells. These cells are exposed to toxins, viruses, and bacteria that may cause abnormal changes.  There are two kinds of cancers of the cervix:   Squamous cell carcinoma This type of cancer starts in the flat or scale-like cells that line the cervix. Squamous cell carcinoma can develop from a sexually transmitted infection caused by the human papillomavirus (HPV).   Adenocarcinoma This type of cervical cancer starts in glandular cells that line the cervix. RISK FACTORS The risk of getting cancer of the cervix is related to your lifestyle, sexual history, health, and immune system. Risks for cervical cancer include:   Having a sexually transmitted viral infection. These include:  Chlamydia.   Herpes.   HPV.  Becoming sexually active before age 18 years.   Having more than one sexual partner or having sex with someone who has more than one sexual partner.   Not using condoms with sexual partners.   Having had cancer of the vagina or vulva.   Having a sexual partner who has or had cancer of the penis or who has had a sexual partner with cervical dysplasia or cervical cancer.   Using oral contraceptives (also called birth control pills).  Smoking.   Having a weakened immune system. For example, human immunodeficiency virus (HIV) or other immune deficiency disorders.   Being the daughter of a woman who took diethylstilbestrol (DES) during pregnancy.   Having a sister or mother who has had cancer of the cervix.   Being African American, Hispanic, Asian, or a woman from the [place].   A history of dysplasia of the cervix. SIGNS AND SYMPTOMS  Symptoms are usually not present in the early stages of  cervical cancer. Once the cancer invades the cervix and surrounding tissues, the woman may have:   Abnormal vaginal bleeding or menstrual bleeding that is longer or heavier than usual.   Bleeding after intercourse, douching, or a Pap test.   Vaginal bleeding following menopause.   Abnormal vaginal discharge.  Pelvic discomfort or pain.  An abnormal Pap test.  Pain during sexual intercourse. Symptoms of more advanced cervical cancer may include:   Loss of appetite or weight loss.   Tiredness (fatigue).   Back and leg pain.   Inability to control urination or bowel movements. DIAGNOSIS  A pelvic exam and Pap test are done to diagnose the condition. If abnormalities are found during the exam or Pap test, the Pap test may be repeated in 3 months, or your health care provider may do additional tests or procedures, such as:   A colposcopy This is a procedure that uses a special microscope that allows the health care provider to magnify and closely examine the cells of the cervix, vagina, and vulva.   Cervical biopsies This is a procedure where small tissue samples are taken from the cervix to be examined under a microscope by a specialist.   A cone biopsy This is a procedure to test for or remove cancerous tissue.  Other tests may be needed, including:   Cystoscopy.   Proctoscopy or sigmoidoscopy.   Ultrasound.   CT scan.   MRI.   Laparoscopy.  There are different stages of cervical cancer:   Stage   0, carcinoma in situ (CIS) This first stage of cancer is the last and most serious stage of dysplasia.   Stage 1 This means the tumor is in the uterus and cervix only.   Stage 2 This means the tumor has spread to the upper vagina. The cancer has spread beyond the uterus, but not to the pelvic walls or lower third of the vagina.   Stage 3 This means the tumor has invaded the side wall of the pelvis and the lower third of the vagina. Blockage of the tubes  that carry urine (ureters) from the tumor may cause urine to back up and cause the kidneys to swell (hydronephrosis).   Stage 4 This means the tumor has spread to the rectum or bladder. In the later part of this stage, it has also spread to distant organs, like the lungs.  TREATMENT  Treatment options can include:   Cone biopsy to remove the cancerous tissue.   Removal of the entire uterus and cervix.   Removal of the uterus, cervix, upper vagina, lymph nodes, and surrounding tissue (modified radical hysterectomy). The ovaries may be left in place or removed.   Medicines to treat cancer.   A combination of surgery, radiation, and chemotherapy.   Biological response modifiers. These are substances that help strengthen your immune system's fight against cancer or infection. They may be used in combination with chemotherapy.  HOME CARE INSTRUCTIONS   Get a gynecology exam and Pap test once every year or as directed by your health care provider.   Get the HPV vaccine.   Do not smoke.  Do not have sexual intercourse until your health care provider says it is okay.  Use a condom every time you have sex. SEEK MEDICAL CARE IF:   You have increased pelvic pain or pressure.   Your are becoming increasingly tired.   You have increased leg or back pain.   You have a fever.  You have abnormal bleeding or discharge.  You lose weight. SEEK IMMEDIATE MEDICAL CARE IF:   You cannot urinate.  You have blood in your urine.   You have blood or pressure with a bowel movement.   You develop severe back, stomach, or pelvic pain. Document Released: 03/10/2005 Document Revised: 11/10/2012 Document Reviewed: 09/01/2012 Exit[employer] Patient Information 2014 [employer].  

## 2013-03-29 NOTE — Progress Notes (Signed)
Consult Note: Gyn-Onc  Stephanie Johnson 42 y.o. female  CC:  Chief Complaint  Patient presents with  . Cervical Cancer    HPI: Patient is seen today in consultation at the request of Dr. Todd Meisinger.  Patient is a 42-year-old gravida 6 aborta 3 who had a Pap smear that was normal 2 years ago but she did have high risk HPV. She went to see her gynecologist for her annual Pap smear. In September, her Pap smear revealed CIN-3 with severe dysplasia. She underwent a LEEP October 28. It revealed CIN-3 with a positive margin. They discussed proceeding with a cold knife conization for definitive treatment and diagnosis. She underwent a cold knife conization on December 16 that revealed: Diagnosis Cervix, cone - INVASIVE ENDOCERVICAL TYPE ADENOCARCINOMA ARISING IN A BACKGROUND OF ENDOCERVICAL ADENOCARCINOMA IN SITU, SEE COMMENT. - HIGH GRADE SQUAMOUS INTRAEPITHELIAL LESION, CIN-III (SEVERE DYSPLASIA/CIS) WITH  ENDOCERVICAL GLANDULAR EXTENSION - IN SITU ADENOCARCINOMA PRESENT AT ENDOCERVICAL MARGIN. - ADENOCARCINOMA IS LESS THAN 1MM FROM DEEP EXCISIONAL MARGIN.  She comes in with her husband today for discussion and evaluation. She stay she's never had a prior abnormal Pap smear. She is a Mirena IUD for contraception. She has had some irregular bleeding which has been fairly random. She's not had significant postcoital bleeding but occasionally she'll have some spotting which she believes is due to small external tears. She's had 3 vaginal deliveries and stay she's with had perineal laceration and continues to have small lacerations with intercourse. She denies any change in her bowel or bladder habits. She doesn't wish is a component of irritable bowel syndrome and is never been fully worked up. The significant family history of breast cancer in her maternal grandmother as well as her mother. Her maternal grandmother was diagnosed with breast cancer in her 60s and had recurrence in her 80s. Her mother was  diagnosed with breast cancer 40s. She recurred approximately 10 years later she subsequently passed away 2011 at the age of 70 from recurrent breast cancer. The patient has been tested and is BRCA negative. She's not yet had a mammogram. She has nursed her children individually for 2-1/2-3 years each.  Review of Systems:  Constitutional: Denies fever. Skin: No rash, sores, jaundice, itching, or dryness.  Cardiovascular: No chest pain, shortness of breath, or edema  Pulmonary: No cough or wheeze.  Gastro Intestinal: No nausea, vomiting, constipation, or diarrhea reported. No bright red blood per rectum or change in bowel movement.  Genitourinary: No frequency, urgency, or dysuria.  Denies vaginal bleeding and discharge.  Musculoskeletal: No myalgia, arthralgia, joint swelling or pain.  Neurologic: No weakness, numbness, or change in gait.  Psychology: No changes   Current Meds:  Outpatient Encounter Prescriptions as of 03/29/2013  Medication Sig  . diphenoxylate-atropine (LOMOTIL) 2.5-0.025 MG per tablet Take 2 tablets by mouth 4 (four) times daily as needed for diarrhea or loose stools.  . HYDROcodone-acetaminophen (NORCO) 5-325 MG per tablet Take 1-2 tablets by mouth every 6 (six) hours as needed for moderate pain.    Allergy:  Allergies  Allergen Reactions  . Sulfa Antibiotics Anaphylaxis    Vomiting  . Augmentin [Amoxicillin-Pot Clavulanate] Nausea And Vomiting    Can take Keflex    Social Hx:   History   Social History  . Marital Status: Married    Spouse Name: N/A    Number of Children: N/A  . Years of Education: N/A   Occupational History  . Not on file.   Social History Main   Topics  . Smoking status: Never Smoker   . Smokeless tobacco: Not on file  . Alcohol Use: Not on file  . Drug Use: Not on file  . Sexual Activity: Not on file   Other Topics Concern  . Not on file   Social History Narrative  . No narrative on file    Past Surgical Hx:  Past Surgical  History  Procedure Laterality Date  . Dilation and curettage of uterus    . Cervical conization w/bx N/A 03/08/2013    Procedure: CONIZATION CERVIX WITH BIOPSY ;  Surgeon: Todd Meisinger, MD;  Location: [facility];  Service: Gynecology;  Laterality: N/A;  1hr OR time    Past Medical Hx:  Past Medical History  Diagnosis Date  . Medical history non-contributory     Oncology Hx:   No history exists.    Family Hx:  Family History  Problem Relation Age of Onset  . Cancer Mother 45    stage IV  . Cancer Father     prostate  . Hypertension Father   . Stroke Father   . Migraines Brother   . Cancer Brother 40    prostate  . Cancer Maternal Grandmother     breast   . Heart failure Paternal Grandfather     congestive  . Cancer Maternal Uncle     prostate cancer  . Cancer Cousin 40    Maternal colon cancer    Vitals:  Blood pressure 116/63, pulse 92, temperature 98 F (36.7 C), resp. rate 20, height 5' 3.78" (1.62 m), weight 147 lb 11.2 oz (66.996 kg).  Physical Exam: Well-nourished well-developed female in no acute distress.  Neck: Supple, no lymphadenopathy, no thyromegaly.  Lungs: Clear to auscultation bilaterally.  Cardiovascular: Regular rate rhythm.  Abdomen: Soft, nontender, nondistended. There is no palpable mass or prostatomegaly.  Groins: No lymphadenopathy.  Extremities: No edema.  Pelvic: Normal external fetal genitalia. Speculum examination reveals a cervix status post cold knife conization with stay sutures intact. A small amount of bleeding from the os. Bimanual examination the cervix is palpably normal status post conization. The uterus is of normal size shape and consistency. There are no adnexal masses. Rectal confirms no parametrial involvement.  Assessment/Plan:  42-year-old with at least adenocarcinoma in situ the cervix. I did pathology today and spoke to Dr. Li regarding the pathology as there were features from the conization that was not clear  including the depth of invasion and width of the tumor. Also there is no comment regarding lymphovascular space involvement. She previously looked at the slides in consultation with another pathologist and he did not see clear evidence of an invasive adenocarcinoma. He saw diffuse adenocarcinoma in situ. She spoke to Dr. Rowland who was the original pathologist reviewing the slides. He believes there is an invasive component. Continued having intradepartmental review. We will also send out to pathology for second opinion secondary to the critical nature regarding implications of treatment. I had a very candid discussion with the patient and her husband regarding this. We outlined a treatment plan should she have a stage IA1 cancer, stage IA2 cancer, or a stage IB tumor. They understand the difference between simple hysterectomy and a radical hysterectomy. We also discussed complications of surgery in general including but not limited to bleeding, infection, injury to surrounding organs and thromboembolic disease. We also discussed the increased risk of lymphedema, lymphocyst as well as the need to proceed with fan oophoropexy in the case of an invasive cancer.   She is tentatively scheduled for a PET/CT on January 22. She knows that she will only need a PET scan and she has a 1A2 or stage IB cervix cancer. She is tentatively scheduled for surgery for January 27 for a simple hysterectomy or radical hysterectomy, pelvic lymph node dissection. We will pursue with bilateral salpingectomy  for ovarian cancer prophylaxis. As stated above she is BRCA negative and unless her ovaries abnormal she would like to retain them.  Their questions were elicited in answer to her satisfaction. She'll contact me if she has any questions. I will call her with the results of her pathology.  Javonta Gronau A., MD 03/29/2013, 2:46 PM  

## 2013-03-31 ENCOUNTER — Other Ambulatory Visit (HOSPITAL_COMMUNITY)
Admission: RE | Admit: 2013-03-31 | Discharge: 2013-03-31 | Disposition: A | Discharge status: Home / Self Care | Payer: BC Managed Care – PPO | Source: Ambulatory Visit | Attending: Anatomic Pathology & Clinical Pathology | Admitting: Anatomic Pathology & Clinical Pathology

## 2013-03-31 DIAGNOSIS — C530 Malignant neoplasm of endocervix: Secondary | ICD-10-CM | POA: Insufficient documentation

## 2013-04-07 ENCOUNTER — Telehealth: Payer: Self-pay | Admitting: Gynecologic Oncology

## 2013-04-07 NOTE — Telephone Encounter (Signed)
Patient informed of pathology results reviewed at [place] understanding.  Informed of Dr. Elenora Gamma recommendations to keep scheduled appointment for her PET scan with potential surgery time begin held on 04/19/13.  Verbalizing understanding.  Advised to call for any concerns.

## 2013-04-12 ENCOUNTER — Telehealth: Payer: Self-pay | Admitting: *Deleted

## 2013-04-12 NOTE — Telephone Encounter (Signed)
Call to pt to clarify allergy to Sulfa- pt advised she has vomiting with Sulfa not Anaphylaxis. Pt also states she had a kidney infection and after being given Augmentin she began to vomit approximately 83mins later. Pt states "I think it was really from the kidney infection not the medication, but they always told me to put it on the allergy list to be safe. Anaphylaxis removed from pt reactions per pt and clarification.

## 2013-04-13 ENCOUNTER — Telehealth: Payer: Self-pay | Admitting: Gynecologic Oncology

## 2013-04-13 NOTE — Telephone Encounter (Signed)
Spoke with patient regarding pathology. Would recommend radical hysterectomy. She will keep her PET scan appointment for tomorrow and I will call her with the results. PG

## 2013-04-14 ENCOUNTER — Ambulatory Visit (HOSPITAL_COMMUNITY)
Admission: RE | Admit: 2013-04-14 | Discharge: 2013-04-14 | Disposition: A | Discharge status: Home / Self Care | Payer: BC Managed Care – PPO | Source: Ambulatory Visit | Attending: Gynecologic Oncology | Admitting: Gynecologic Oncology

## 2013-04-14 ENCOUNTER — Encounter (HOSPITAL_COMMUNITY): Payer: Self-pay

## 2013-04-14 ENCOUNTER — Encounter (HOSPITAL_COMMUNITY)
Admission: RE | Admit: 2013-04-14 | Discharge: 2013-04-14 | Disposition: A | Discharge status: Home / Self Care | Payer: BC Managed Care – PPO | Source: Ambulatory Visit | Attending: Gynecologic Oncology | Admitting: Gynecologic Oncology

## 2013-04-14 DIAGNOSIS — D069 Carcinoma in situ of cervix, unspecified: Secondary | ICD-10-CM

## 2013-04-14 DIAGNOSIS — C539 Malignant neoplasm of cervix uteri, unspecified: Secondary | ICD-10-CM | POA: Insufficient documentation

## 2013-04-14 HISTORY — DX: Disease of stomach and duodenum, unspecified: K31.9

## 2013-04-14 HISTORY — DX: Malignant (primary) neoplasm, unspecified: C80.1

## 2013-04-14 LAB — URINALYSIS, ROUTINE W REFLEX MICROSCOPIC
BILIRUBIN URINE: NEGATIVE
Glucose, UA: NEGATIVE mg/dL
HGB URINE DIPSTICK: NEGATIVE
Ketones, ur: NEGATIVE mg/dL
Leukocytes, UA: NEGATIVE
Nitrite: NEGATIVE
Protein, ur: NEGATIVE mg/dL
SPECIFIC GRAVITY, URINE: 1.019 (ref 1.005–1.030)
Urobilinogen, UA: 1 mg/dL (ref 0.0–1.0)
pH: 7.5 (ref 5.0–8.0)

## 2013-04-14 LAB — CBC WITH DIFFERENTIAL/PLATELET
Basophils Absolute: 0 10*3/uL (ref 0.0–0.1)
Basophils Relative: 1 % (ref 0–1)
EOS PCT: 1 % (ref 0–5)
Eosinophils Absolute: 0 10*3/uL (ref 0.0–0.7)
HCT: 34.8 % — ABNORMAL LOW (ref 36.0–46.0)
Hemoglobin: 12.2 g/dL (ref 12.0–15.0)
LYMPHS ABS: 1.7 10*3/uL (ref 0.7–4.0)
Lymphocytes Relative: 51 % — ABNORMAL HIGH (ref 12–46)
MCH: 31.3 pg (ref 26.0–34.0)
MCHC: 35.1 g/dL (ref 30.0–36.0)
MCV: 89.2 fL (ref 78.0–100.0)
Monocytes Absolute: 0.3 10*3/uL (ref 0.1–1.0)
Monocytes Relative: 8 % (ref 3–12)
NEUTROS ABS: 1.3 10*3/uL — AB (ref 1.7–7.7)
Neutrophils Relative %: 39 % — ABNORMAL LOW (ref 43–77)
PLATELETS: 136 10*3/uL — AB (ref 150–400)
RBC: 3.9 MIL/uL (ref 3.87–5.11)
RDW: 12 % (ref 11.5–15.5)
WBC: 3.3 10*3/uL — AB (ref 4.0–10.5)

## 2013-04-14 LAB — COMPREHENSIVE METABOLIC PANEL
ALK PHOS: 47 U/L (ref 39–117)
ALT: 13 U/L (ref 0–35)
AST: 16 U/L (ref 0–37)
Albumin: 3.9 g/dL (ref 3.5–5.2)
BUN: 10 mg/dL (ref 6–23)
CALCIUM: 8.8 mg/dL (ref 8.4–10.5)
CHLORIDE: 103 meq/L (ref 96–112)
CO2: 27 meq/L (ref 19–32)
Creatinine, Ser: 0.62 mg/dL (ref 0.50–1.10)
GFR calc Af Amer: >90 mL/min (ref >90)
Glucose, Bld: 86 mg/dL (ref 70–99)
POTASSIUM: 4.3 meq/L (ref 3.7–5.3)
SODIUM: 139 meq/L (ref 137–147)
Total Bilirubin: 0.4 mg/dL (ref 0.3–1.2)
Total Protein: 7.4 g/dL (ref 6.0–8.3)

## 2013-04-14 LAB — PREGNANCY, URINE: Preg Test, Ur: NEGATIVE

## 2013-04-14 LAB — GLUCOSE, CAPILLARY: GLUCOSE-CAPILLARY: 94 mg/dL (ref 70–99)

## 2013-04-14 LAB — ABO/RH: ABO/RH(D): O POS

## 2013-04-14 MED ORDER — FLUDEOXYGLUCOSE F - 18 (FDG) INJECTION
16.1000 | Freq: Once | INTRAVENOUS | Status: AC | PRN
  Administered 2013-04-14: 16.1 via INTRAVENOUS

## 2013-04-14 NOTE — Patient Instructions (Addendum)
[place]  04/14/2013   Your procedure is scheduled on: 1-27  -2015  Report to [facility] at      [place]  AM .  Call this number if you have problems the morning of surgery: [phone]  Or Presurgical Testing [phone](Marlaina Coburn) For Living Will and/or Health Care Power Attorney Forms: please provide copy for your medical record,may bring AM of surgery(Forms should be already notarized -we do not provide this service).  Remember: Follow any bowel prep instructions per MD office. For Cpap use: Bring mask and tubing only.   Do not eat food:After Midnight(04-07-13 Sunday night).  May have clear liquids: Start Clear Liquid diet x 24 hours preop. Then nothing after 12 midnight 04-18-13 Monday night. Clear liquids include soda, tea, black coffee, apple or grape juice, broth.  Take these medicines the morning of surgery with A SIP OF WATER: Hydrocodone(if needed)   Do not wear jewelry, make-up or nail polish.  Do not wear lotions, powders, or perfumes. You may wear deodorant.  Do not shave 12 hours prior to first CHG shower(legs and under arms).(face and neck okay.)  Do not bring valuables to the hospital.(Hospital is not responsible for lost valuables).  Contacts, dentures or removable bridgework, body piercing, hair pins may not be worn into surgery.  Leave suitcase in the car. After surgery it may be brought to your room.  For patients admitted to the hospital, checkout time is 11:00 AM the day of discharge.(Restricted visitors-Persons, age 24 or younger - may not visit at this time.)     Patients discharged the same day of surgery will not be allowed to drive home. Must have responsible person with you x 24 hours once discharged.  Name and phone number of your driver: Casimira Sutphin ,spouse [phone] cell  Special Instructions: CHG(Chlorhedine 4%-"Hibiclens","Betasept","Aplicare") Shower Use Special Wash: see special instructions.(avoid face and genitals)   Please read over  the following fact sheets that you were given: MRSA Information, Blood Transfusion fact sheet, Incentive Spirometry Instruction.  Remember : Type/Screen "Blue armbands" - may not be removed once applied(would result in being retested AM of surgery, if removed).  Failure to follow these instructions may result in Cancellation of your surgery.   Patient signature_______________________________________________________    04-14-13

## 2013-04-15 ENCOUNTER — Telehealth: Payer: Self-pay | Admitting: Gynecologic Oncology

## 2013-04-15 ENCOUNTER — Encounter (HOSPITAL_COMMUNITY): Payer: Self-pay

## 2013-04-15 NOTE — Telephone Encounter (Signed)
Patient notified of PET scan results.  No concerns voiced.  Instructed to call for any needs.

## 2013-04-15 NOTE — Pre-Procedure Instructions (Signed)
04-14-13 CXR done today. Pt. Had PET scan earlier today.

## 2013-04-18 ENCOUNTER — Telehealth: Payer: Self-pay | Admitting: *Deleted

## 2013-04-18 NOTE — Telephone Encounter (Signed)
Call to pt with reminder to have only clear liquids today, NPO after midnight for surgery 1/27. Pt verbalized understanding, no concerns at this time.

## 2013-04-19 ENCOUNTER — Encounter (HOSPITAL_COMMUNITY)
Admission: RE | Disposition: A | Discharge status: Home / Self Care | Payer: Self-pay | Source: Ambulatory Visit | Attending: Obstetrics & Gynecology

## 2013-04-19 ENCOUNTER — Encounter (HOSPITAL_COMMUNITY): Payer: BC Managed Care – PPO | Admitting: Anesthesiology

## 2013-04-19 ENCOUNTER — Encounter (HOSPITAL_COMMUNITY): Payer: Self-pay | Admitting: *Deleted

## 2013-04-19 ENCOUNTER — Ambulatory Visit (HOSPITAL_COMMUNITY)
Admission: RE | Admit: 2013-04-19 | Discharge: 2013-04-20 | Disposition: A | Discharge status: Home / Self Care | Payer: BC Managed Care – PPO | Source: Ambulatory Visit | Attending: Obstetrics & Gynecology | Admitting: Obstetrics & Gynecology

## 2013-04-19 ENCOUNTER — Ambulatory Visit (HOSPITAL_COMMUNITY): Payer: BC Managed Care – PPO | Admitting: Anesthesiology

## 2013-04-19 DIAGNOSIS — C539 Malignant neoplasm of cervix uteri, unspecified: Secondary | ICD-10-CM | POA: Insufficient documentation

## 2013-04-19 DIAGNOSIS — N83209 Unspecified ovarian cyst, unspecified side: Secondary | ICD-10-CM | POA: Insufficient documentation

## 2013-04-19 DIAGNOSIS — D069 Carcinoma in situ of cervix, unspecified: Secondary | ICD-10-CM

## 2013-04-19 DIAGNOSIS — Z79899 Other long term (current) drug therapy: Secondary | ICD-10-CM | POA: Insufficient documentation

## 2013-04-19 DIAGNOSIS — Z803 Family history of malignant neoplasm of breast: Secondary | ICD-10-CM | POA: Insufficient documentation

## 2013-04-19 DIAGNOSIS — C530 Malignant neoplasm of endocervix: Secondary | ICD-10-CM | POA: Diagnosis present

## 2013-04-19 HISTORY — PX: ROBOTIC ASSISTED TOTAL HYSTERECTOMY WITH BILATERAL SALPINGO OOPHERECTOMY: SHX6086

## 2013-04-19 LAB — TYPE AND SCREEN
ABO/RH(D): O POS
Antibody Screen: NEGATIVE

## 2013-04-19 SURGERY — ROBOTIC ASSISTED TOTAL HYSTERECTOMY WITH BILATERAL SALPINGO OOPHORECTOMY
Anesthesia: General

## 2013-04-19 MED ORDER — EPHEDRINE SULFATE 50 MG/ML IJ SOLN
INTRAMUSCULAR | Status: DC | PRN
  Administered 2013-04-19: 7.5 mg via INTRAVENOUS

## 2013-04-19 MED ORDER — CISATRACURIUM BESYLATE (PF) 10 MG/5ML IV SOLN
INTRAVENOUS | Status: DC | PRN
  Administered 2013-04-19: 8 mg via INTRAVENOUS
  Administered 2013-04-19: 3 mg via INTRAVENOUS
  Administered 2013-04-19: 4 mg via INTRAVENOUS
  Administered 2013-04-19: 3 mg via INTRAVENOUS

## 2013-04-19 MED ORDER — ONDANSETRON HCL 4 MG PO TABS: 4.0000 mg | ORAL_TABLET | Freq: Four times a day (QID) | ORAL | Status: DC | PRN

## 2013-04-19 MED ORDER — ONDANSETRON HCL 4 MG/2ML IJ SOLN
INTRAMUSCULAR | Status: DC | PRN
  Administered 2013-04-19: 4 mg via INTRAVENOUS

## 2013-04-19 MED ORDER — HYDROMORPHONE HCL PF 1 MG/ML IJ SOLN
INTRAMUSCULAR | Status: AC
  Filled 2013-04-19: qty 1

## 2013-04-19 MED ORDER — ZOLPIDEM TARTRATE 5 MG PO TABS: 5.0000 mg | ORAL_TABLET | Freq: Every evening | ORAL | Status: DC | PRN

## 2013-04-19 MED ORDER — CEFAZOLIN SODIUM-DEXTROSE 2-3 GM-% IV SOLR
INTRAVENOUS | Status: AC
  Filled 2013-04-19: qty 50

## 2013-04-19 MED ORDER — HYDROMORPHONE HCL PF 1 MG/ML IJ SOLN: 0.5000 mg | INTRAMUSCULAR | Status: DC | PRN

## 2013-04-19 MED ORDER — CISATRACURIUM BESYLATE 20 MG/10ML IV SOLN
INTRAVENOUS | Status: AC
  Filled 2013-04-19: qty 10

## 2013-04-19 MED ORDER — LACTATED RINGERS IV SOLN
INTRAVENOUS | Status: DC | PRN
  Administered 2013-04-19: 07:00:00 via INTRAVENOUS

## 2013-04-19 MED ORDER — EPHEDRINE SULFATE 50 MG/ML IJ SOLN
INTRAMUSCULAR | Status: AC
  Filled 2013-04-19: qty 1

## 2013-04-19 MED ORDER — DEXAMETHASONE SODIUM PHOSPHATE 10 MG/ML IJ SOLN
INTRAMUSCULAR | Status: DC | PRN
  Administered 2013-04-19: 10 mg via INTRAVENOUS

## 2013-04-19 MED ORDER — NEOSTIGMINE METHYLSULFATE 1 MG/ML IJ SOLN
INTRAMUSCULAR | Status: DC | PRN
  Administered 2013-04-19: 4 mg via INTRAVENOUS

## 2013-04-19 MED ORDER — FENTANYL CITRATE 0.05 MG/ML IJ SOLN
INTRAMUSCULAR | Status: DC | PRN
  Administered 2013-04-19 (×3): 50 ug via INTRAVENOUS
  Administered 2013-04-19: 100 ug via INTRAVENOUS

## 2013-04-19 MED ORDER — STERILE WATER FOR IRRIGATION IR SOLN
Status: DC | PRN
  Administered 2013-04-19: 1000 mL

## 2013-04-19 MED ORDER — KETOROLAC TROMETHAMINE 30 MG/ML IJ SOLN
15.0000 mg | Freq: Four times a day (QID) | INTRAMUSCULAR | Status: AC
  Administered 2013-04-19 – 2013-04-20 (×3): 15 mg via INTRAVENOUS
  Filled 2013-04-19 (×4): qty 1

## 2013-04-19 MED ORDER — ONDANSETRON HCL 4 MG/2ML IJ SOLN
INTRAMUSCULAR | Status: AC
  Filled 2013-04-19: qty 2

## 2013-04-19 MED ORDER — MIDAZOLAM HCL 5 MG/5ML IJ SOLN
INTRAMUSCULAR | Status: DC | PRN
  Administered 2013-04-19: 2 mg via INTRAVENOUS

## 2013-04-19 MED ORDER — PROPOFOL 10 MG/ML IV BOLUS
INTRAVENOUS | Status: AC
  Filled 2013-04-19: qty 20

## 2013-04-19 MED ORDER — LACTATED RINGERS IR SOLN
Status: DC | PRN
  Administered 2013-04-19: 100 mL

## 2013-04-19 MED ORDER — GLYCOPYRROLATE 0.2 MG/ML IJ SOLN
INTRAMUSCULAR | Status: AC
  Filled 2013-04-19: qty 3

## 2013-04-19 MED ORDER — DEXAMETHASONE SODIUM PHOSPHATE 10 MG/ML IJ SOLN
INTRAMUSCULAR | Status: AC
  Filled 2013-04-19: qty 1

## 2013-04-19 MED ORDER — CEFAZOLIN SODIUM-DEXTROSE 2-3 GM-% IV SOLR
2.0000 g | INTRAVENOUS | Status: AC
  Administered 2013-04-19: 2 g via INTRAVENOUS

## 2013-04-19 MED ORDER — MIDAZOLAM HCL 2 MG/2ML IJ SOLN
INTRAMUSCULAR | Status: AC
  Filled 2013-04-19: qty 2

## 2013-04-19 MED ORDER — PROPOFOL 10 MG/ML IV BOLUS
INTRAVENOUS | Status: DC | PRN
  Administered 2013-04-19: 120 mg via INTRAVENOUS

## 2013-04-19 MED ORDER — KCL IN DEXTROSE-NACL 20-5-0.45 MEQ/L-%-% IV SOLN
INTRAVENOUS | Status: AC
  Filled 2013-04-19: qty 1000

## 2013-04-19 MED ORDER — LACTATED RINGERS IV SOLN: INTRAVENOUS | Status: DC

## 2013-04-19 MED ORDER — FENTANYL CITRATE 0.05 MG/ML IJ SOLN
INTRAMUSCULAR | Status: AC
  Filled 2013-04-19: qty 5

## 2013-04-19 MED ORDER — KCL IN DEXTROSE-NACL 20-5-0.45 MEQ/L-%-% IV SOLN
INTRAVENOUS | Status: DC
  Administered 2013-04-19 – 2013-04-20 (×2): via INTRAVENOUS
  Filled 2013-04-19 (×3): qty 1000

## 2013-04-19 MED ORDER — HYDROMORPHONE HCL PF 1 MG/ML IJ SOLN
0.2500 mg | INTRAMUSCULAR | Status: DC | PRN
  Administered 2013-04-19 (×2): 0.5 mg via INTRAVENOUS

## 2013-04-19 MED ORDER — ENOXAPARIN SODIUM 40 MG/0.4ML ~~LOC~~ SOLN
40.0000 mg | SUBCUTANEOUS | Status: AC
  Administered 2013-04-19: 40 mg via SUBCUTANEOUS
  Filled 2013-04-19: qty 0.4

## 2013-04-19 MED ORDER — GLYCOPYRROLATE 0.2 MG/ML IJ SOLN
INTRAMUSCULAR | Status: DC | PRN
  Administered 2013-04-19: 0.6 mg via INTRAVENOUS

## 2013-04-19 MED ORDER — MEPERIDINE HCL 50 MG/ML IJ SOLN: 6.2500 mg | INTRAMUSCULAR | Status: DC | PRN

## 2013-04-19 MED ORDER — OXYCODONE-ACETAMINOPHEN 5-325 MG PO TABS
1.0000 | ORAL_TABLET | ORAL | Status: DC | PRN
  Administered 2013-04-20: 1 via ORAL
  Filled 2013-04-19: qty 1

## 2013-04-19 MED ORDER — PROMETHAZINE HCL 25 MG/ML IJ SOLN: 6.2500 mg | INTRAMUSCULAR | Status: DC | PRN

## 2013-04-19 MED ORDER — KETOROLAC TROMETHAMINE 30 MG/ML IJ SOLN
15.0000 mg | Freq: Four times a day (QID) | INTRAMUSCULAR | Status: AC
  Filled 2013-04-19 (×3): qty 1

## 2013-04-19 MED ORDER — SODIUM CHLORIDE 0.9 % IJ SOLN
INTRAMUSCULAR | Status: AC
  Filled 2013-04-19: qty 10

## 2013-04-19 MED ORDER — ONDANSETRON HCL 4 MG/2ML IJ SOLN
4.0000 mg | Freq: Four times a day (QID) | INTRAMUSCULAR | Status: DC | PRN
Start: 2013-04-19 — End: 2013-04-20

## 2013-04-19 SURGICAL SUPPLY — 62 items
APL SKNCLS STERI-STRIP NONHPOA (GAUZE/BANDAGES/DRESSINGS) ×1
APPLIER CLIP ROT 10 11.4 M/L (STAPLE) ×3
APR CLP MED LRG 11.4X10 (STAPLE) ×1
BAG SPEC RTRVL LRG 6X4 10 (ENDOMECHANICALS) ×2
BENZOIN TINCTURE PRP APPL 2/3 (GAUZE/BANDAGES/DRESSINGS) ×3 IMPLANT
CABLE HIGH FREQUENCY MONO STRZ (ELECTRODE) ×3 IMPLANT
CHLORAPREP W/TINT 26ML (MISCELLANEOUS) ×3 IMPLANT
CLIP APPLIE ROT 10 11.4 M/L (STAPLE) IMPLANT
CLOSURE WOUND 1/2 X4 (GAUZE/BANDAGES/DRESSINGS) ×1
CORDS BIPOLAR (ELECTRODE) ×3 IMPLANT
COVER MAYO STAND STRL (DRAPES) ×3 IMPLANT
COVER SURGICAL LIGHT HANDLE (MISCELLANEOUS) ×3 IMPLANT
COVER TIP SHEARS 8 DVNC (MISCELLANEOUS) ×1 IMPLANT
COVER TIP SHEARS 8MM DA VINCI (MISCELLANEOUS) ×2
DRAPE LG THREE QUARTER DISP (DRAPES) ×6 IMPLANT
DRAPE SURG IRRIG POUCH 19X23 (DRAPES) ×3 IMPLANT
DRAPE TABLE BACK 44X90 PK DISP (DRAPES) ×6 IMPLANT
DRAPE UTILITY XL STRL (DRAPES) ×3 IMPLANT
DRAPE WARM FLUID 44X44 (DRAPE) ×3 IMPLANT
DRSG TEGADERM 2-3/8X2-3/4 SM (GAUZE/BANDAGES/DRESSINGS) ×11 IMPLANT
DRSG TEGADERM 4X4.75 (GAUZE/BANDAGES/DRESSINGS) ×3 IMPLANT
DRSG TEGADERM 6X8 (GAUZE/BANDAGES/DRESSINGS) ×6 IMPLANT
ELECT REM PT RETURN 9FT ADLT (ELECTROSURGICAL) ×3
ELECTRODE REM PT RTRN 9FT ADLT (ELECTROSURGICAL) ×1 IMPLANT
GAUZE SPONGE 2X2 8PLY STRL LF (GAUZE/BANDAGES/DRESSINGS) ×1 IMPLANT
GLOVE BIO SURGEON STRL SZ 6.5 (GLOVE) ×4 IMPLANT
GLOVE BIO SURGEON STRL SZ7.5 (GLOVE) ×2 IMPLANT
GLOVE BIO SURGEONS STRL SZ 6.5 (GLOVE) ×2
GLOVE BIOGEL PI IND STRL 7.0 (GLOVE) ×2 IMPLANT
GLOVE BIOGEL PI INDICATOR 7.0 (GLOVE) ×6
GOWN STRL REUS W/ TWL XL LVL3 (GOWN DISPOSABLE) ×2 IMPLANT
GOWN STRL REUS W/TWL LRG LVL3 (GOWN DISPOSABLE) ×9 IMPLANT
GOWN STRL REUS W/TWL XL LVL3 (GOWN DISPOSABLE) ×6
HOLDER FOLEY CATH W/STRAP (MISCELLANEOUS) ×3 IMPLANT
KIT ACCESSORY DA VINCI DISP (KITS) ×2
KIT ACCESSORY DVNC DISP (KITS) ×1 IMPLANT
KIT BASIN OR (CUSTOM PROCEDURE TRAY) ×3 IMPLANT
MANIPULATOR UTERINE 4.5 ZUMI (MISCELLANEOUS) ×3 IMPLANT
OCCLUDER COLPOPNEUMO (BALLOONS) ×3 IMPLANT
POUCH SPECIMEN RETRIEVAL 10MM (ENDOMECHANICALS) ×6 IMPLANT
SET TUBE IRRIG SUCTION NO TIP (IRRIGATION / IRRIGATOR) ×3 IMPLANT
SHEET LAVH (DRAPES) ×3 IMPLANT
SOLUTION ANTI FOG 6CC (MISCELLANEOUS) ×3 IMPLANT
SOLUTION ELECTROLUBE (MISCELLANEOUS) ×3 IMPLANT
SPONGE GAUZE 2X2 STER 10/PKG (GAUZE/BANDAGES/DRESSINGS) ×2
SPONGE LAP 18X18 X RAY DECT (DISPOSABLE) IMPLANT
STRIP CLOSURE SKIN 1/2X4 (GAUZE/BANDAGES/DRESSINGS) ×2 IMPLANT
SUT VIC AB 0 CT1 27 (SUTURE) ×12
SUT VIC AB 0 CT1 27XBRD ANTBC (SUTURE) ×3 IMPLANT
SUT VIC AB 4-0 PS2 27 (SUTURE) ×6 IMPLANT
SUT VICRYL 0 UR6 27IN ABS (SUTURE) ×3 IMPLANT
SYR 50ML LL SCALE MARK (SYRINGE) ×3 IMPLANT
SYR BULB IRRIGATION 50ML (SYRINGE) IMPLANT
TOWEL OR 17X26 10 PK STRL BLUE (TOWEL DISPOSABLE) ×4 IMPLANT
TRAP SPECIMEN MUCOUS 40CC (MISCELLANEOUS) ×1 IMPLANT
TRAY FOLEY CATH 14FRSI W/METER (CATHETERS) ×3 IMPLANT
TRAY LAP CHOLE (CUSTOM PROCEDURE TRAY) ×3 IMPLANT
TROCAR 12M 150ML BLUNT (TROCAR) ×3 IMPLANT
TROCAR BLADELESS OPT 5 100 (ENDOMECHANICALS) ×3 IMPLANT
TROCAR XCEL 12X100 BLDLESS (ENDOMECHANICALS) ×3 IMPLANT
TUBING INSUFFLATION 10FT LAP (TUBING) ×3 IMPLANT
WATER STERILE IRR 1500ML POUR (IV SOLUTION) ×6 IMPLANT

## 2013-04-19 NOTE — Transfer of Care (Signed)
Immediate Anesthesia Transfer of Care Note  Patient: Stephanie Johnson  Procedure(s) Performed: Procedure(s): ROBOTIC ASSISTED RADICAL HYSTERECTOMY WITH BILATERAL SALPINGECTOMY AND PELVIC LYMPH NODE DISSECTION (N/A)  Patient Location: PACU  Anesthesia Type:General  Level of Consciousness: awake, sedated and patient cooperative  Airway & Oxygen Therapy: Patient Spontanous Breathing and Patient connected to face mask oxygen  Post-op Assessment: Report given to PACU RN and Post -op Vital signs reviewed and stable  Post vital signs: Reviewed and stable  Complications: No apparent anesthesia complications

## 2013-04-19 NOTE — Op Note (Signed)
PATIENT: Stephanie Johnson DATE OF BIRTH: 10-05-1970 ENCOUNTER DATE: 04/19/2013   Preop Diagnosis: Adenocarcinoma of the cervix stage IB Postoperative Diagnosis: same.   Surgery: Total robotic radical hysterectomy, bilateral salpingectomies, bilateral pelvic lymph node dissection, bilateral oophoropexy  Surgeons:  Jenipher Havel A. Alycia Rossetti, MD; Lahoma Crocker, MD   Anesthesia: General   Estimated blood loss:  100 ml   IVF:  1750 ml   Urine output:  027 ml   Complications: None   Pathology: Uterus, cervix, bilateral tubes, bilateral pelvic lymph nodes to pathology.   Operative findings: At the time of exam under anesthesia. The cervix was 3 cm in size and the uterus was freely mobile. On rectovaginal examination the parametria were free. There were no pathologically enlarged lymph nodes. There was a benign-appearing right ovarian cyst. Normal abdominal survey.  Procedure: The patient was identified in the preoperative holding area. Informed consent was signed on the chart. Patient was seen history was reviewed and exam was performed.   The patient was then taken to the operating room and placed in the supine position with SCD hose on. She was then placed in the dorsolithotomy position. Her arms were tucked at her side with appropriate precautions on the gel pad. General anesthesia was then induced without difficulty. Shoulder blocks were then placed in the usual fashion with appropriate precautions. A OG-tube was placed to suction. First timeout was performed to confirm the patient, procedure, antibiotic, allergy status, estimated blood loss and OR time. The perineum was then prepped in the usual fashion with Betadine. A 14 French Foley was inserted into the bladder under sterile conditions. A sterile EEA sizer with a balloon was placed into the vagina. The abdomen was then prepped with 1 Chlor prep sponge per protocol.   Patient was then draped after the prep was dried. Second timeout was performed  to confirm the above. After again confirming OG tube placement and it was to suction. A stab-wound was made in left upper quadrant 2 cm below the costal margin on the left in the midclavicular line. A 5 mm operative report was used to assure intra-abdominal placement. The abdomen was insufflated. At this point all points during the procedure the patient's intra-abdominal pressure was not increased over 15 mm of mercury. After insufflation was complete, the patient was placed in deep Trendelenburg position. 20 cm above the pubic symphysis that area was marked the camera port. Bilateral robotic ports were marked 10 cm from the midline incision at approximately 5 angle. The fourth arm was marked 2 cm superior and medial to the ACIS. Under direct visualization each of the trochars was placed into the abdomen. The small bowel was folded on its mesentery to allow visualization to the pelvis. The 5 mm LUQ port was then converted to a 10/12 port under direct visualization.  After assuring adequate visualization, the robot was then docked in the usual fashion. Under direct visualization the robotic instruments replaced.   The round ligament on the patient's right side was transected with monopolar cautery. The posterior leaf of the broad ligament was then taken down in the usual fashion. The paravesical and pararectal spaces were opened. The uterine artery was skeletonized and cauterized with bipolar cautery. It was then transected with monopolar cautery. The ureter was identified on the medial leaf of the broad ligament. It was freed from its medial attachments to the peritoneum. In order to free the peritoneum, our attention was then drawn to the fallopian tube on the right. Using bipolar cautery a  salpingectomy was performed. A window was then made between the IP and the ureter. The utero-ovarian artery was coagulated with bipolar cautery and transected.   This allowed the ovary to be elevated into the superior aspect  of the abdomen. The posterior leaf of the broad ligament was taken down to the level of the uterosacral ligaments. Using the EEA sizer in the vagina we were able to delineate the posterior margin. The space between the vagina and the rectum was opened. There was some fibrosis in this area making it slightly more difficult. However, we were able to skeletonize the uterosacral ligaments. We proceeded laterally and the uterine artery and vein were brought over the ureter. We continued our dissection the level of the bladder. Anteriorly the bladder flap was created. There was minimal fibrosis and adhesive disease in this area. Again, using the EEA sizer we were able to delineate the anterior vaginal margin. We continued skeletonizing the ureter anteriorly. The lateral bladder pillars were taken down, identified and cauterized with bipolar cautery. There were then transected. This allowed [place] to completely mobilized the bladder inferiorly and the ureter was taken inferiorly and laterally. Once we were pleased with the anterior vaginal margin, the same procedure was performed on the patient's left side.   Once the left side was completed we were able to mobilize the bladder anteriorly to achieve a 2 cm margin. On the posterior aspect again there was some fibrosis however we were able to dissect the rectum off of the vagina. The uterosacrals were coagulated with bipolar cautery and transected. The small amount of bleeding from the left uterosacral. This was controlled with bipolar cautery. The ureter was well lateral of this area of dissection. The pneumo occluded balloon was then insufflated. Again using the EEA sizer in the vagina we initially marked the vaginal margin. Using monopolar cautery the colpotomy was performed. The specimen was delivered to the vagina without difficulty. I inspected the specimen and decided to take more posterior margin. An additional 1 cm posterior margin was taken with monopolar cautery.  The pneumo occluded balloon was replaced into the vagina.  The obturator nerve was identified on the right side. The nodal bundle extending over the common iliac artery down to the external iliac artery and the circumflex iliac vein was taken down using sharp dissection and monopolar cautery. The genitofemoral nerve was identified and spared. We continued our dissection down the external iliac vein to the level of the obturator nerve. The nodal bundle superior to the obturator nerve was taken. All pedicles were noted to be hemostatic. The ureter was noted to be well medial of the area of dissection. The nodal bundle was then placed and an Endo catch bag. The same procedure was performed on the left side. The nodal bundles were placed in Endo catch bag and delivered through the vagina.  The vaginal cuff was closed with a running 0 Vicryl on CT 1 suture starting from each angle and tied individually in the middle of the vaginal cuff. Our attention was then directed towards performing the oophoropexy. Using a laparoscopic clip applier, the right ovary was marked. Using a suture of 0 Vicryl on CT 1 the ovary was tacked to the lateral peritoneum as high as possible. We ensured there is no twisting of the vascular supply and that there was not too much tension on the blood supply. A similar procedure was performed on the left.  The abdomen and pelvis were copiously irrigated and noted to be hemostatic.  The robotic instruments were removed under direct visualization as were the robotic trochars. The pneumoperitoneum was removed. The patient was then taken out of the Trendelenburg position. Using of 0 Vicryl on a UR 6 needle the midline port fascia was closed after being grasped with allis clamps. The subcutaneous tissues of the port in the left upper quadrant was reapproximated. The skin was closed using 4-0 Vicryl. Steri-Strips and benzoin were applied. The pneumo occluded balloon was removed from the vagina. The  vagina was swabbed and noted to be hemostatic.   All instrument needle and Ray-Tec counts were correct x2. The patient tolerated the procedure well and was taken to the recovery room in stable condition. This is Nancy Marus dictating an operative note on patient Stephanie Johnson.

## 2013-04-19 NOTE — Interval H&P Note (Signed)
History and Physical Interval Note:  04/19/2013 7:28 AM  Stephanie Johnson  has presented today for surgery, with the diagnosis of CERVICAL CANCER  The various methods of treatment have been discussed with the patient and family. After consideration of risks, benefits and other options for treatment, the patient has consented to  Procedure(s): ROBOTIC ASSISTED RADICAL HYSTERECTOMY WITH BILATERAL SALPINGO AND PELVIC LYMPH NODE DISSECTION (N/A) as a surgical intervention .  The patient's history has been reviewed, patient examined, no change in status, stable for surgery.  I have reviewed the patient's chart and labs.  Questions were answered to the patient's satisfaction.     S[place]sea A.

## 2013-04-19 NOTE — H&P (View-Only) (Signed)
Consult Note: Gyn-Onc  Stephanie Johnson 42 y.o. female  CC:  Chief Complaint  Patient presents with  . Cervical Cancer    HPI: Patient is seen today in consultation at the request of Dr. Todd Meisinger.  Patient is a 42-year-old gravida 6 aborta 3 who had a Pap smear that was normal 2 years ago but she did have high risk HPV. She went to see her gynecologist for her annual Pap smear. In September, her Pap smear revealed CIN-3 with severe dysplasia. She underwent a LEEP October 28. It revealed CIN-3 with a positive margin. They discussed proceeding with a cold knife conization for definitive treatment and diagnosis. She underwent a cold knife conization on December 16 that revealed: Diagnosis Cervix, cone - INVASIVE ENDOCERVICAL TYPE ADENOCARCINOMA ARISING IN A BACKGROUND OF ENDOCERVICAL ADENOCARCINOMA IN SITU, SEE COMMENT. - HIGH GRADE SQUAMOUS INTRAEPITHELIAL LESION, CIN-III (SEVERE DYSPLASIA/CIS) WITH  ENDOCERVICAL GLANDULAR EXTENSION - IN SITU ADENOCARCINOMA PRESENT AT ENDOCERVICAL MARGIN. - ADENOCARCINOMA IS LESS THAN 1MM FROM DEEP EXCISIONAL MARGIN.  She comes in with her husband today for discussion and evaluation. She stay she's never had a prior abnormal Pap smear. She is a Mirena IUD for contraception. She has had some irregular bleeding which has been fairly random. She's not had significant postcoital bleeding but occasionally she'll have some spotting which she believes is due to small external tears. She's had 3 vaginal deliveries and stay she's with had perineal laceration and continues to have small lacerations with intercourse. She denies any change in her bowel or bladder habits. She doesn't wish is a component of irritable bowel syndrome and is never been fully worked up. The significant family history of breast cancer in her maternal grandmother as well as her mother. Her maternal grandmother was diagnosed with breast cancer in her 60s and had recurrence in her 80s. Her mother was  diagnosed with breast cancer 40s. She recurred approximately 10 years later she subsequently passed away 2011 at the age of 70 from recurrent breast cancer. The patient has been tested and is BRCA negative. She's not yet had a mammogram. She has nursed her children individually for 2-1/2-3 years each.  Review of Systems:  Constitutional: Denies fever. Skin: No rash, sores, jaundice, itching, or dryness.  Cardiovascular: No chest pain, shortness of breath, or edema  Pulmonary: No cough or wheeze.  Gastro Intestinal: No nausea, vomiting, constipation, or diarrhea reported. No bright red blood per rectum or change in bowel movement.  Genitourinary: No frequency, urgency, or dysuria.  Denies vaginal bleeding and discharge.  Musculoskeletal: No myalgia, arthralgia, joint swelling or pain.  Neurologic: No weakness, numbness, or change in gait.  Psychology: No changes   Current Meds:  Outpatient Encounter Prescriptions as of 03/29/2013  Medication Sig  . diphenoxylate-atropine (LOMOTIL) 2.5-0.025 MG per tablet Take 2 tablets by mouth 4 (four) times daily as needed for diarrhea or loose stools.  . HYDROcodone-acetaminophen (NORCO) 5-325 MG per tablet Take 1-2 tablets by mouth every 6 (six) hours as needed for moderate pain.    Allergy:  Allergies  Allergen Reactions  . Sulfa Antibiotics Anaphylaxis    Vomiting  . Augmentin [Amoxicillin-Pot Clavulanate] Nausea And Vomiting    Can take Keflex    Social Hx:   History   Social History  . Marital Status: Married    Spouse Name: N/A    Number of Children: N/A  . Years of Education: N/A   Occupational History  . Not on file.   Social History Main   Topics  . Smoking status: Never Smoker   . Smokeless tobacco: Not on file  . Alcohol Use: Not on file  . Drug Use: Not on file  . Sexual Activity: Not on file   Other Topics Concern  . Not on file   Social History Narrative  . No narrative on file    Past Surgical Hx:  Past Surgical  History  Procedure Laterality Date  . Dilation and curettage of uterus    . Cervical conization w/bx N/A 03/08/2013    Procedure: CONIZATION CERVIX WITH BIOPSY ;  Surgeon: Todd Meisinger, MD;  Location: [facility];  Service: Gynecology;  Laterality: N/A;  1hr OR time    Past Medical Hx:  Past Medical History  Diagnosis Date  . Medical history non-contributory     Oncology Hx:   No history exists.    Family Hx:  Family History  Problem Relation Age of Onset  . Cancer Mother 45    stage IV  . Cancer Father     prostate  . Hypertension Father   . Stroke Father   . Migraines Brother   . Cancer Brother 40    prostate  . Cancer Maternal Grandmother     breast   . Heart failure Paternal Grandfather     congestive  . Cancer Maternal Uncle     prostate cancer  . Cancer Cousin 40    Maternal colon cancer    Vitals:  Blood pressure 116/63, pulse 92, temperature 98 F (36.7 C), resp. rate 20, height 5' 3.78" (1.62 m), weight 147 lb 11.2 oz (66.996 kg).  Physical Exam: Well-nourished well-developed female in no acute distress.  Neck: Supple, no lymphadenopathy, no thyromegaly.  Lungs: Clear to auscultation bilaterally.  Cardiovascular: Regular rate rhythm.  Abdomen: Soft, nontender, nondistended. There is no palpable mass or prostatomegaly.  Groins: No lymphadenopathy.  Extremities: No edema.  Pelvic: Normal external fetal genitalia. Speculum examination reveals a cervix status post cold knife conization with stay sutures intact. A small amount of bleeding from the os. Bimanual examination the cervix is palpably normal status post conization. The uterus is of normal size shape and consistency. There are no adnexal masses. Rectal confirms no parametrial involvement.  Assessment/Plan:  42-year-old with at least adenocarcinoma in situ the cervix. I did pathology today and spoke to Dr. Li regarding the pathology as there were features from the conization that was not clear  including the depth of invasion and width of the tumor. Also there is no comment regarding lymphovascular space involvement. She previously looked at the slides in consultation with another pathologist and he did not see clear evidence of an invasive adenocarcinoma. He saw diffuse adenocarcinoma in situ. She spoke to Dr. Rowland who was the original pathologist reviewing the slides. He believes there is an invasive component. Continued having intradepartmental review. We will also send out to pathology for second opinion secondary to the critical nature regarding implications of treatment. I had a very candid discussion with the patient and her husband regarding this. We outlined a treatment plan should she have a stage IA1 cancer, stage IA2 cancer, or a stage IB tumor. They understand the difference between simple hysterectomy and a radical hysterectomy. We also discussed complications of surgery in general including but not limited to bleeding, infection, injury to surrounding organs and thromboembolic disease. We also discussed the increased risk of lymphedema, lymphocyst as well as the need to proceed with fan oophoropexy in the case of an invasive cancer.   She is tentatively scheduled for a PET/CT on January 22. She knows that she will only need a PET scan and she has a 1A2 or stage IB cervix cancer. She is tentatively scheduled for surgery for January 27 for a simple hysterectomy or radical hysterectomy, pelvic lymph node dissection. We will pursue with bilateral salpingectomy  for ovarian cancer prophylaxis. As stated above she is BRCA negative and unless her ovaries abnormal she would like to retain them.  Their questions were elicited in answer to her satisfaction. She'll contact me if she has any questions. I will call her with the results of her pathology.  GEHRIG,PAOLA A., MD 03/29/2013, 2:46 PM  

## 2013-04-19 NOTE — Anesthesia Preprocedure Evaluation (Signed)
Anesthesia Evaluation  Patient identified by MRN, date of birth, ID band Patient awake    Reviewed: Allergy & Precautions, H&P , NPO status , Patient's Chart, lab work & pertinent test results, reviewed documented beta blocker date and time   Airway Mallampati: II TM Distance: >3 FB Neck ROM: full    Dental no notable dental hx.    Pulmonary neg pulmonary ROS,  breath sounds clear to auscultation  Pulmonary exam normal       Cardiovascular negative cardio ROS  Rhythm:regular Rate:Normal     Neuro/Psych negative neurological ROS  negative psych ROS   GI/Hepatic negative GI ROS, Neg liver ROS,   Endo/Other  negative endocrine ROS  Renal/GU negative Renal ROS  negative genitourinary   Musculoskeletal negative musculoskeletal ROS (+)   Abdominal   Peds negative pediatric ROS (+)  Hematology negative hematology ROS (+)   Anesthesia Other Findings   Reproductive/Obstetrics negative OB ROS                           Anesthesia Physical  Anesthesia Plan  ASA: II  Anesthesia Plan: General   Post-op Pain Management:    Induction: Intravenous  Airway Management Planned: Oral ETT  Additional Equipment:   Intra-op Plan:   Post-operative Plan: Extubation in OR  Informed Consent: I have reviewed the patients History and Physical, chart, labs and discussed the procedure including the risks, benefits and alternatives for the proposed anesthesia with the patient or authorized representative who has indicated his/her understanding and acceptance.   Dental Advisory Given and Dental advisory given  Plan Discussed with: CRNA and Surgeon  Anesthesia Plan Comments:         Anesthesia Quick Evaluation

## 2013-04-19 NOTE — Anesthesia Postprocedure Evaluation (Signed)
  Anesthesia Post-op Note  Patient: Stephanie Johnson  Procedure(s) Performed: Procedure(s) (LRB): ROBOTIC ASSISTED RADICAL HYSTERECTOMY WITH BILATERAL SALPINGECTOMY AND PELVIC LYMPH NODE DISSECTION (N/A)  Patient Location: PACU  Anesthesia Type: General  Level of Consciousness: awake and alert   Airway and Oxygen Therapy: Patient Spontanous Breathing  Post-op Pain: mild  Post-op Assessment: Post-op Vital signs reviewed, Patient's Cardiovascular Status Stable, Respiratory Function Stable, Patent Airway and No signs of Nausea or vomiting  Last Vitals:  Filed Vitals:   04/19/13 1439  BP: 112/76  Pulse: 71  Temp: 36.7 C  Resp: 18    Post-op Vital Signs: stable   Complications: No apparent anesthesia complications

## 2013-04-20 ENCOUNTER — Encounter (HOSPITAL_COMMUNITY): Payer: Self-pay | Admitting: Gynecologic Oncology

## 2013-04-20 LAB — BASIC METABOLIC PANEL
BUN: 7 mg/dL (ref 6–23)
CALCIUM: 8.3 mg/dL — AB (ref 8.4–10.5)
CO2: 26 mEq/L (ref 19–32)
Chloride: 103 mEq/L (ref 96–112)
Creatinine, Ser: 0.79 mg/dL (ref 0.50–1.10)
GFR calc Af Amer: >90 mL/min (ref >90)
GLUCOSE: 121 mg/dL — AB (ref 70–99)
Potassium: 4.3 mEq/L (ref 3.7–5.3)
Sodium: 138 mEq/L (ref 137–147)

## 2013-04-20 LAB — CBC
HCT: 31.3 % — ABNORMAL LOW (ref 36.0–46.0)
Hemoglobin: 10.5 g/dL — ABNORMAL LOW (ref 12.0–15.0)
MCH: 30.3 pg (ref 26.0–34.0)
MCHC: 33.5 g/dL (ref 30.0–36.0)
MCV: 90.5 fL (ref 78.0–100.0)
PLATELETS: 208 10*3/uL (ref 150–400)
RBC: 3.46 MIL/uL — ABNORMAL LOW (ref 3.87–5.11)
RDW: 12.6 % (ref 11.5–15.5)
WBC: 10.8 10*3/uL — ABNORMAL HIGH (ref 4.0–10.5)

## 2013-04-20 MED ORDER — OXYCODONE-ACETAMINOPHEN 5-325 MG PO TABS: 1.0000 | ORAL_TABLET | ORAL | Status: DC | PRN

## 2013-04-20 NOTE — Plan of Care (Signed)
Problem: Phase I Progression Outcomes Goal: Admission history reviewed Outcome: Progressing Admission history completed and reviewed.  Goal: Dangle/OOB as tolerated per MD order Outcome: Progressing Pt dangled and was OOB x1.  Goal: VS, stable, temp < 100.4 degrees F Outcome: Progressing Vital signs stable and normal for patient. Pt remains afebrile.  Goal: I & O every 4 hrs or as ordered Outcome: Progressing I&O assessed q4h.  Goal: IS, TCDB as ordered Outcome: Progressing Pt using incentive spirometer correctly.

## 2013-04-20 NOTE — Discharge Summary (Signed)
Physician Discharge Summary  Patient ID: Stephanie Johnson MRN: [id number] DOB/AGE: Jul 23, 1970 43 y.o.  Admit date: 04/19/2013 Discharge date: 04/20/2013  Admission Diagnoses: Cervical cancer  Discharge Diagnoses:  Principal Problem:   Cervical cancer  Discharged Condition:  The patient is in good condition and stable for discharge.    Hospital Course: On 04/19/2013, the patient underwent the following: Procedure(s): ROBOTIC ASSISTED RADICAL HYSTERECTOMY WITH BILATERAL SALPINGECTOMY AND PELVIC LYMPH NODE DISSECTION.  The postoperative course was uneventful.  She was discharged to home on postoperative day 1 tolerating a regular diet.  Consults: None  Significant Diagnostic Studies: None  Treatments: surgery: see above  Discharge Exam: Blood pressure 104/70, pulse 81, temperature 97.8 F (36.6 C), temperature source Oral, resp. rate 16, height 5' 3.75" (1.619 m), weight 137 lb 6 oz (62.313 kg), SpO2 96.00%. General appearance: alert, cooperative and no distress Resp: clear to auscultation bilaterally Cardio: regular rate and rhythm, S1, S2 normal, no murmur, click, rub or gallop GI: soft, non-tender; bowel sounds normal; no masses,  no organomegaly Extremities: extremities normal, atraumatic, no cyanosis or edema Incision/Wound: Lap sites with steri strips clean, dry, and intact  Disposition: 01-Home or Self Care      Discharge Orders   Future Appointments Provider Department Dept Phone   04/26/2013 10:00 AM Dorothyann Gibbs, NP [facility] [phone]   05/11/2013 11:15 AM Paola A. Alycia Rossetti, [facility] [phone]   Future Orders Complete By Expires   Call MD for:  difficulty breathing, headache or visual disturbances  As directed    Call MD for:  extreme fatigue  As directed    Call MD for:  hives  As directed    Call MD for:  persistant dizziness or light-headedness  As directed    Call MD for:   persistant nausea and vomiting  As directed    Call MD for:  redness, tenderness, or signs of infection (pain, swelling, redness, odor or green/yellow discharge around incision site)  As directed    Call MD for:  severe uncontrolled pain  As directed    Call MD for:  temperature >100.4  As directed    Diet - low sodium heart healthy  As directed    Driving Restrictions  As directed    Comments:     No driving for 1 week.  Do not take narcotics and drive.   Increase activity slowly  As directed    Lifting restrictions  As directed    Comments:     No lifting greater than 10 lbs.   Sexual Activity Restrictions  As directed    Comments:     No sexual activity, nothing in the vagina, for 8 weeks.       Medication List    STOP taking these medications       HYDROcodone-acetaminophen 5-325 MG per tablet  Commonly known as:  NORCO      TAKE these medications       diphenoxylate-atropine 2.5-0.025 MG per tablet  Commonly known as:  LOMOTIL  Take 2 tablets by mouth 4 (four) times daily as needed for diarrhea or loose stools.     oxyCODONE-acetaminophen 5-325 MG per tablet  Commonly known as:  PERCOCET/ROXICET  Take 1-2 tablets by mouth every 4 (four) hours as needed for severe pain (moderate to severe pain (when tolerating fluids)).       Follow-up Information   Follow up with CROSS, MELISSA DEAL, NP On 04/26/2013. (  at 10:00am at the [facility])    Specialty:  Gynecologic Oncology   Contact information:   [address] [place] [address] [phone]       Follow up with Nancy Marus A., MD On 05/11/2013. (at 11:15am.  )    Specialty:  Gynecologic Oncology   Contact information:   [address] [place] [address] [phone]       Greater than thirty minutes were spend for face to face discharge instructions and discharge orders/summary in EPIC.   Signed: CROSS, MELISSA DEAL 04/20/2013, 10:59 AM

## 2013-04-20 NOTE — Discharge Instructions (Signed)
04/20/2013  Return to work: 3-4 weeks  Activity: 1. Be up and out of the bed during the day.  Take a nap if needed.  You may walk up steps but be careful and use the hand rail.  Stair climbing will tire you more than you think, you may need to stop part way and rest.   2. No lifting or straining for 6 weeks.  3. No driving for 1 week.  Do not drive if you are taking narcotic pain medicine.  4. Shower daily.  Use soap and water on your incision and pat dry; don't rub.   5. No sexual activity and nothing in the vagina for 8 weeks.  Diet: 1. Low sodium Heart Healthy Diet is recommended.  2. It is safe to use a laxative if you have difficulty moving your bowels.   Wound Care: 1. Keep clean and dry.  Shower daily.  Reasons to call the Doctor:  Fever - Oral temperature greater than 100.4 degrees Fahrenheit  Foul-smelling vaginal discharge  Difficulty urinating  Nausea and vomiting  Increased pain at the site of the incision that is unrelieved with pain medicine.  Difficulty breathing with or without chest pain  New calf pain especially if only on one side  Sudden, continuing increased vaginal bleeding with or without clots.   Contacts: For questions or concerns you should contact:  Dr. Lahoma Crocker at [phone]  Dr. Alycia Rossetti at [facility]  Joylene John, NP at [phone]  Acetaminophen; Oxycodone tablets What is this medicine? ACETAMINOPHEN; OXYCODONE (a set a MEE noe fen; ox i KOE done) is a pain reliever. It is used to treat mild to moderate pain. This medicine may be used for other purposes; ask your health care provider or pharmacist if you have questions. COMMON BRAND NAME(S): Endocet, Magnacet, Narvox, Percocet, Perloxx, Primalev, Primlev, Roxicet, Xolox What should I tell my health care provider before I take this medicine? They need to know if you have any of these conditions: -brain tumor -Crohn's disease, inflammatory bowel disease, or  ulcerative colitis -drug abuse or addiction -head injury -heart or circulation problems -if you often drink alcohol -kidney disease or problems going to the bathroom -liver disease -lung disease, asthma, or breathing problems -an unusual or allergic reaction to acetaminophen, oxycodone, other opioid analgesics, other medicines, foods, dyes, or preservatives -pregnant or trying to get pregnant -breast-feeding How should I use this medicine? Take this medicine by mouth with a full glass of water. Follow the directions on the prescription label. Take your medicine at regular intervals. Do not take your medicine more often than directed. Talk to your pediatrician regarding the use of this medicine in children. Special care may be needed. Patients over 37 years old may have a stronger reaction and need a smaller dose. Overdosage: If you think you have taken too much of this medicine contact a poison control center or emergency room at once. NOTE: This medicine is only for you. Do not share this medicine with others. What if I miss a dose? If you miss a dose, take it as soon as you can. If it is almost time for your next dose, take only that dose. Do not take double or extra doses. What may interact with this medicine? -alcohol -antihistamines -barbiturates like amobarbital, butalbital, butabarbital, methohexital, pentobarbital, phenobarbital, thiopental, and secobarbital -benztropine -drugs for bladder problems like solifenacin, trospium, oxybutynin, tolterodine, hyoscyamine, and methscopolamine -drugs for breathing problems like ipratropium and tiotropium -drugs for certain stomach or intestine problems  like propantheline, homatropine methylbromide, glycopyrrolate, atropine, belladonna, and dicyclomine -general anesthetics like etomidate, ketamine, nitrous oxide, propofol, desflurane, enflurane, halothane, isoflurane, and sevoflurane -medicines for depression, anxiety, or psychotic  disturbances -medicines for sleep -muscle relaxants -naltrexone -narcotic medicines (opiates) for pain -phenothiazines like perphenazine, thioridazine, chlorpromazine, mesoridazine, fluphenazine, prochlorperazine, promazine, and trifluoperazine -scopolamine -tramadol -trihexyphenidyl This list may not describe all possible interactions. Give your health care provider a list of all the medicines, herbs, non-prescription drugs, or dietary supplements you use. Also tell them if you smoke, drink alcohol, or use illegal drugs. Some items may interact with your medicine. What should I watch for while using this medicine? Tell your doctor or health care professional if your pain does not go away, if it gets worse, or if you have new or a different type of pain. You may develop tolerance to the medicine. Tolerance means that you will need a higher dose of the medication for pain relief. Tolerance is normal and is expected if you take this medicine for a long time. Do not suddenly stop taking your medicine because you may develop a severe reaction. Your body becomes used to the medicine. This does NOT mean you are addicted. Addiction is a behavior related to getting and using a drug for a non-medical reason. If you have pain, you have a medical reason to take pain medicine. Your doctor will tell you how much medicine to take. If your doctor wants you to stop the medicine, the dose will be slowly lowered over time to avoid any side effects. You may get drowsy or dizzy. Do not drive, use machinery, or do anything that needs mental alertness until you know how this medicine affects you. Do not stand or sit up quickly, especially if you are an older patient. This reduces the risk of dizzy or fainting spells. Alcohol may interfere with the effect of this medicine. Avoid alcoholic drinks. There are different types of narcotic medicines (opiates) for pain. If you take more than one type at the same time, you may have  more side effects. Give your health care provider a list of all medicines you use. Your doctor will tell you how much medicine to take. Do not take more medicine than directed. Call emergency for help if you have problems breathing. The medicine will cause constipation. Try to have a bowel movement at least every 2 to 3 days. If you do not have a bowel movement for 3 days, call your doctor or health care professional. Do not take Tylenol (acetaminophen) or medicines that have acetaminophen with this medicine. Too much acetaminophen can be very dangerous. Many nonprescription medicines contain acetaminophen. Always read the labels carefully to avoid taking more acetaminophen. What side effects may I notice from receiving this medicine? Side effects that you should report to your doctor or health care professional as soon as possible: -allergic reactions like skin rash, itching or hives, swelling of the face, lips, or tongue -breathing difficulties, wheezing -confusion -light headedness or fainting spells -severe stomach pain -unusually weak or tired -yellowing of the skin or the whites of the eyes  Side effects that usually do not require medical attention (report to your doctor or health care professional if they continue or are bothersome): -dizziness -drowsiness -nausea -vomiting This list may not describe all possible side effects. Call your doctor for medical advice about side effects. You may report side effects to FDA at 1-800-FDA-1088. Where should I keep my medicine? Keep out of the reach of children. This  medicine can be abused. Keep your medicine in a safe place to protect it from theft. Do not share this medicine with anyone. Selling or giving away this medicine is dangerous and against the law. Store at room temperature between 20 and 25 degrees C (68 and 77 degrees F). Keep container tightly closed. Protect from light. This medicine may cause accidental overdose and death if it is  taken by other adults, children, or pets. Flush any unused medicine down the toilet to reduce the chance of harm. Do not use the medicine after the expiration date. NOTE: This sheet is a summary. It may not cover all possible information. If you have questions about this medicine, talk to your doctor, pharmacist, or health care provider.  2014, [employer]. (2012-11-01 13:17:35)

## 2013-04-21 ENCOUNTER — Telehealth: Payer: Self-pay | Admitting: Gynecologic Oncology

## 2013-04-21 NOTE — Telephone Encounter (Signed)
LM for patient to call back for results. PG

## 2013-04-22 ENCOUNTER — Telehealth: Payer: Self-pay | Admitting: Gynecologic Oncology

## 2013-04-22 NOTE — Telephone Encounter (Signed)
Spoke with the patient about final path results and the need for no further treatment.  Verbalizing understanding.  No concerns voiced.  Instructed to call for any needs or concerns.

## 2013-04-26 ENCOUNTER — Encounter (INDEPENDENT_AMBULATORY_CARE_PROVIDER_SITE_OTHER): Payer: Self-pay

## 2013-04-26 ENCOUNTER — Ambulatory Visit: Payer: BC Managed Care – PPO | Attending: Gynecologic Oncology | Admitting: Gynecologic Oncology

## 2013-04-26 ENCOUNTER — Ambulatory Visit: Payer: BC Managed Care – PPO

## 2013-04-26 VITALS — BP 106/67 | HR 112 | Temp 98.0°F | Resp 18 | Wt 146.8 lb

## 2013-04-26 DIAGNOSIS — Z960 Presence of urogenital implants: Secondary | ICD-10-CM

## 2013-04-26 DIAGNOSIS — M545 Low back pain, unspecified: Secondary | ICD-10-CM

## 2013-04-26 LAB — URINALYSIS, MICROSCOPIC - CHCC
BILIRUBIN (URINE): NEGATIVE
Glucose: NEGATIVE mg/dL
KETONES: NEGATIVE mg/dL
NITRITE: NEGATIVE
Protein: <30 mg/dL
Specific Gravity, Urine: 1.025 (ref 1.003–1.035)
Urobilinogen, UR: 0.2 mg/dL (ref 0.2–1)
pH: 6 (ref 4.6–8.0)

## 2013-04-26 MED ORDER — OXYCODONE-ACETAMINOPHEN 5-325 MG PO TABS: 1.0000 | ORAL_TABLET | ORAL | Status: DC | PRN

## 2013-04-26 NOTE — Patient Instructions (Signed)
Doing well.  We will contact you with the results of your urine sample from today.  Please call the office for any issues voiding or any other concerns or the development of any new symptoms.  Plan to follow up as scheduled or sooner if needed.

## 2013-04-26 NOTE — Progress Notes (Signed)
Follow Up Note: Gyn-Onc  Stephanie Johnson 43 y.o. female  CC:  Chief Complaint  Patient presents with  . Routine Post-op, Bladder Training    Follow up    HPI:  Stephanie Johnson is a 37 year old, gravida 6 aborta 3, who had a pap smear that was normal 2 years ago but she did have high risk HPV. She went to see her gynecologist for her annual Pap smear in September, with her Pap smear revealing CIN-3 with severe dysplasia. She underwent a LEEP on October 28, which revealed CIN-3 with a positive margin. They discussed proceeding with a cold knife conization for definitive treatment and diagnosis. She underwent a cold knife conization on December 16 that revealed:  Diagnosis, Cervix, cone:  - INVASIVE ENDOCERVICAL TYPE ADENOCARCINOMA ARISING IN A BACKGROUND OF ENDOCERVICAL ADENOCARCINOMA IN SITU, SEE COMMENT.  - HIGH GRADE SQUAMOUS INTRAEPITHELIAL LESION, CIN-III (SEVERE DYSPLASIA/CIS) WITH ENDOCERVICAL GLANDULAR EXTENSION  - IN SITU ADENOCARCINOMA PRESENT AT ENDOCERVICAL MARGIN.  - ADENOCARCINOMA IS LESS THAN 1MM FROM DEEP EXCISIONAL MARGIN.   The specimen was submitted to Dr. Annamaria Boots at [facility] to affirm the original diagnosis.  Per Dr. Annamaria Boots, there is indeed endocervical adenocarcinoma, grade 2 out of 3.  Given the presence of tumor at excision margins, the tumor is at least 7 mm wide and has a depth of at least 4 mm. No lymph vascular invasion is identified and severe squamous dysplasia is present.  On 04/19/13, she underwent a total robotic radical hysterectomy, bilateral salpingectomies, bilateral pelvic lymph node dissection, bilateral oophoropexy by Dr. Nancy Marus.  Her post-operative course was uneventful.  Final pathology revealed: 1. Fallopian tube, right - UNREMARKABLE FALLOPIAN TUBE. NO ENDOMETRIOSIS OR MALIGNANCY. 2. Fallopian tube, left - UNREMARKABLE FALLOPIAN TUBE. NO ENDOMETRIOSIS OR MALIGNANCY. 3. Uterus +/- tubes/ovaries, neoplastic, with cervix - CERVIX: SQUAMOUS  METAPLASIA AND ENDOCERVICAL GLANDULAR TUBAL METAPLASIA ASSOCIATED WITH INFLAMMATION AND FIBROSIS. NO RESIDUAL MALIGNANCY IDENTIFIED. - ENDOMETRIUM AND MYOMETRIUM: BENIGN WITH NO EVIDENCE OF MALIGNANCY. 4. Vagina, biopsy, posterior margin - BENIGN SQUAMOUS MUCOSA. NO EVIDENCE OF MALIGNANCY. 5. Lymph nodes, regional resection, right pelvic - THREE BENIGN LYMPH NODES (0/3). 6. Lymph nodes, regional resection, left pelvic - THREE BENIGN LYMPH NODES (0/3).  Interval History:  She presents today for post-operative follow up with bladder training/foley removal.  She reports doing well since surgery.  Ambulating without difficulty.  Mild left upper thigh numbness reported at times since surgery.  No difficulty moving the left lower extremity.  Foley catheter has been draining clear, yellow urine.  Adequate output reported.  Reporting lower back pain intermittently.  She does not feel that she may have a urinary tract infection but reports having "a kidney infection in the past with little symptoms until a fever developed."  Abdominal pain reported more at nighttime.  Alternating ibuprofen with one percocet at times.  Minimal vaginal bleeding reported.  Bowels functioning without difficulty and taking stool softeners as needed.  Final pathology discussed with the patient.  No concerns voiced.  Review of Systems  Constitutional: Feels well.  No fever, chills, early satiety, change in weight or appetite.  Cardiovascular: No chest pain, shortness of breath, or edema.  Pulmonary: No cough or wheeze.  Gastrointestinal: No nausea, vomiting, or diarrhea. No bright red blood per rectum or change in bowel movement.  Genitourinary: No frequency, urgency, or dysuria. No vaginal bleeding or discharge.  Musculoskeletal: No myalgia or joint pain.  Intermittent lower back pain. Neurologic: No weakness, numbness, or change in  gait.  Psychology: No depression, anxiety, or insomnia.  Current Meds:  Outpatient Encounter  Prescriptions as of 04/26/2013  Medication Sig  . diphenoxylate-atropine (LOMOTIL) 2.5-0.025 MG per tablet Take 2 tablets by mouth 4 (four) times daily as needed for diarrhea or loose stools.  Marland Kitchen oxyCODONE-acetaminophen (PERCOCET/ROXICET) 5-325 MG per tablet Take 1-2 tablets by mouth every 4 (four) hours as needed for severe pain (moderate to severe pain (when tolerating fluids)).    Allergy:  Allergies  Allergen Reactions  . Augmentin [Amoxicillin-Pot Clavulanate] Nausea And Vomiting    Can take Keflex  . Sulfa Antibiotics Nausea And Vomiting    Vomiting    Social Hx:   History   Social History  . Marital Status: Married    Spouse Name: N/A    Number of Children: N/A  . Years of Education: N/A   Occupational History  . Not on file.   Social History Main Topics  . Smoking status: Never Smoker   . Smokeless tobacco: Never Used  . Alcohol Use: Yes     Comment: rare social  . Drug Use: No  . Sexual Activity: Yes   Other Topics Concern  . Not on file   Social History Narrative  . No narrative on file    Past Surgical Hx:  Past Surgical History  Procedure Laterality Date  . Dilation and curettage of uterus    . Cervical conization w/bx N/A 03/08/2013    Procedure: CONIZATION CERVIX WITH BIOPSY ;  Surgeon: Cheri Fowler, MD;  Location: [facility];  Service: Gynecology;  Laterality: N/A;  1hr OR time  . Childbirth      x 3 NVD  . Robotic assisted total hysterectomy with bilateral salpingo oopherectomy N/A 04/19/2013    Procedure: ROBOTIC ASSISTED RADICAL HYSTERECTOMY WITH BILATERAL SALPINGECTOMY AND PELVIC LYMPH NODE DISSECTION;  Surgeon: Imagene Gurney A. Alycia Rossetti, MD;  Location: [facility];  Service: Gynecology;  Laterality: N/A;    Past Medical Hx:  Past Medical History  Diagnosis Date  . Cancer     dx. cervical cancer s/p 12'14 cone biopsy  . Stomach problems     "stomach sensitive freq"    Family Hx:  Family History  Problem Relation Age of Onset  . Cancer Mother 44     stage IV  . Cancer Father     prostate  . Hypertension Father   . Stroke Father   . Migraines Brother   . Cancer Brother 40    prostate  . Cancer Maternal Grandmother     breast   . Heart failure Paternal Grandfather     congestive  . Cancer Maternal Uncle     prostate cancer  . Cancer Cousin 40    Maternal colon cancer    Vitals:  Blood pressure 106/67, pulse 112, temperature 98 F (36.7 C), temperature source Oral, resp. rate 18, weight 146 lb 12.8 oz (66.588 kg), last menstrual period 03/24/2013.  Physical Exam:  General: Well developed, well nourished female in no acute distress. Alert and oriented x 3.  Cardiovascular: Regular rate and rhythm. S1 and S2 normal.  Lungs: Clear to auscultation bilaterally. No wheezes/crackles/rhonchi noted.  Skin: No rashes or lesions present. Back: No CVA tenderness.  Abdomen: Abdomen soft, non-tender and non-obese. Active bowel sounds in all quadrants.  Lap sites healing well.  Steris strips removed.  No drainage or erythema.   Extremities: No bilateral cyanosis, edema, or clubbing.  Foley catheter to leg bag with clear, yellow urine draining.  Foley removed  without difficulty.    Assessment/Plan:  43 year old s/p total robotic radical hysterectomy, bilateral salpingectomies, bilateral pelvic lymph node dissection, bilateral oophoropexy by Dr. Nancy Marus on 04/19/13 with no residual malignancy identified on final pathology.  She is doing well post-operatively.  Foley catheter removed without difficulty.  Urine sample obtained to rule out UTI due to lack of symptoms with bladder infections and development of lower back pain.  She voided 150 cc clear, yellow urine after foley removal.  Reportable signs and symptoms reviewed.  She is to follow up with Dr. Alycia Rossetti on 05/11/13 or sooner if needed.  She is advised to call for any questions or concerns.     Cleo Santucci DEAL, NP 04/26/2013, 11:47 AM

## 2013-04-27 ENCOUNTER — Telehealth: Payer: Self-pay | Admitting: *Deleted

## 2013-04-27 LAB — URINE CULTURE

## 2013-04-27 MED ORDER — CIPROFLOXACIN HCL 500 MG PO TABS: 500.0000 mg | ORAL_TABLET | Freq: Two times a day (BID) | ORAL | Status: DC

## 2013-04-27 NOTE — Telephone Encounter (Signed)
Pt called states : I had temp of 99.5 tues, chills, still having some back pain. I'm peeing fine." Pt denies pain with urination or burning. Temp 99.5 this am. Reviewed with NP, Rx for Cipro sent to pt's pharmacy.

## 2013-04-29 NOTE — Telephone Encounter (Signed)
Urine culture with insufficient growth but continue Cipro per Dr. Alycia Rossetti. Pt reports feeling better, still having mild back pain but its better than it was. Encouraged pt to take anti-inflammatory meds to help with pain, drinking plenty of fluids. Pt verbalized understanding and will call next week to check in about how she is feeling.

## 2013-05-09 ENCOUNTER — Other Ambulatory Visit: Payer: Self-pay | Admitting: Gynecologic Oncology

## 2013-05-09 ENCOUNTER — Telehealth: Payer: Self-pay | Admitting: *Deleted

## 2013-05-09 DIAGNOSIS — C539 Malignant neoplasm of cervix uteri, unspecified: Secondary | ICD-10-CM

## 2013-05-09 DIAGNOSIS — Z960 Presence of urogenital implants: Secondary | ICD-10-CM

## 2013-05-09 DIAGNOSIS — M545 Low back pain, unspecified: Secondary | ICD-10-CM

## 2013-05-09 MED ORDER — OXYCODONE-ACETAMINOPHEN 5-325 MG PO TABS: 1.0000 | ORAL_TABLET | ORAL | Status: DC | PRN

## 2013-05-09 NOTE — Telephone Encounter (Signed)
Pt called states " I think I threw away my pain medication by mistake, I'm needing something for pain at night, not all the time, just on the days I have to drive." Discussed with pt when taking pain meds she is not to drive, pt verbalized understanding and confirmed she would only be using this at night prior to bed if needed. Pt taking Ibuprofen for other pain during daytime. Will review with provider. 04/26/13-Pt received  Rx forPercocet 5-325  Take 1-2 tablets q 4 hrs for pain. Q#20. Message forward to provider for review.

## 2013-05-09 NOTE — Telephone Encounter (Signed)
Left pt message on vm regarding rx refill for pain medication ready for pick up

## 2013-05-10 ENCOUNTER — Telehealth: Payer: Self-pay | Admitting: *Deleted

## 2013-05-10 NOTE — Telephone Encounter (Signed)
Confirmed pt appt for 2/18

## 2013-05-11 ENCOUNTER — Ambulatory Visit: Payer: BC Managed Care – PPO | Attending: Gynecologic Oncology | Admitting: Gynecologic Oncology

## 2013-05-11 ENCOUNTER — Encounter: Payer: Self-pay | Admitting: Gynecologic Oncology

## 2013-05-11 VITALS — BP 100/83 | HR 85 | Temp 97.5°F | Resp 24 | Ht 63.78 in | Wt 143.4 lb

## 2013-05-11 DIAGNOSIS — Z9079 Acquired absence of other genital organ(s): Secondary | ICD-10-CM | POA: Insufficient documentation

## 2013-05-11 DIAGNOSIS — C539 Malignant neoplasm of cervix uteri, unspecified: Secondary | ICD-10-CM | POA: Insufficient documentation

## 2013-05-11 DIAGNOSIS — Z90710 Acquired absence of both cervix and uterus: Secondary | ICD-10-CM | POA: Insufficient documentation

## 2013-05-11 NOTE — Patient Instructions (Signed)
Return to clinic in 3 months for first surveillance exam and in 3 weeks for a final post operative check.

## 2013-05-11 NOTE — Progress Notes (Signed)
Follow Up Note: Gyn-Onc  Stephanie Johnson 43 y.o. female  CC:  Chief Complaint  Patient presents with  . Follow-up    HPI:  Stephanie Johnson is a 41 year old, gravida 6 aborta 3, who had a pap smear that was normal 2 years ago but she did have high risk HPV. She went to see her gynecologist for her annual Pap smear in September, with her Pap smear revealing CIN-3 with severe dysplasia. She underwent a LEEP on October 28, which revealed CIN-3 with a positive margin. They discussed proceeding with a cold knife conization for definitive treatment and diagnosis. She underwent a cold knife conization on December 16 that revealed:  Diagnosis, Cervix, cone:  - INVASIVE ENDOCERVICAL TYPE ADENOCARCINOMA ARISING IN A BACKGROUND OF ENDOCERVICAL ADENOCARCINOMA IN SITU, SEE COMMENT.  - HIGH GRADE SQUAMOUS INTRAEPITHELIAL LESION, CIN-III (SEVERE DYSPLASIA/CIS) WITH ENDOCERVICAL GLANDULAR EXTENSION  - IN SITU ADENOCARCINOMA PRESENT AT ENDOCERVICAL MARGIN.  - ADENOCARCINOMA IS LESS THAN 1MM FROM DEEP EXCISIONAL MARGIN.   The specimen was submitted to Dr. Annamaria Boots at [facility] to affirm the original diagnosis.  Per Dr. Annamaria Boots, there is indeed endocervical adenocarcinoma, grade 2 out of 3.  Given the presence of tumor at excision margins, the tumor is at least 7 mm wide and has a depth of at least 4 mm. No lymph vascular invasion is identified and severe squamous dysplasia is present.  On 04/19/13, she underwent a total robotic radical hysterectomy, bilateral salpingectomies, bilateral pelvic lymph node dissection, bilateral oophoropexy by Dr. Nancy Marus.  Her post-operative course was uneventful.  Final pathology revealed: 1. Fallopian tube, right - UNREMARKABLE FALLOPIAN TUBE. NO ENDOMETRIOSIS OR MALIGNANCY. 2. Fallopian tube, left - UNREMARKABLE FALLOPIAN TUBE. NO ENDOMETRIOSIS OR MALIGNANCY. 3. Uterus +/- tubes/ovaries, neoplastic, with cervix - CERVIX: SQUAMOUS METAPLASIA AND ENDOCERVICAL GLANDULAR  TUBAL METAPLASIA ASSOCIATED WITH INFLAMMATION AND FIBROSIS. NO RESIDUAL MALIGNANCY IDENTIFIED. - ENDOMETRIUM AND MYOMETRIUM: BENIGN WITH NO EVIDENCE OF MALIGNANCY. 4. Vagina, biopsy, posterior margin - BENIGN SQUAMOUS MUCOSA. NO EVIDENCE OF MALIGNANCY. 5. Lymph nodes, regional resection, right pelvic - THREE BENIGN LYMPH NODES (0/3). 6. Lymph nodes, regional resection, left pelvic - THREE BENIGN LYMPH NODES (0/3).  Interval History:  She presents today for post-operative follow up.  She went back to work earlier this week. Monday was very busy day at work and she was fatigued after working all day. She is trying to avoid not doing any lifting. She still has some discomfort when she bears don't have a bowel movement. She feels that she empties her bladder well but it does feel "strange". She is requiring one pain pill at night to help her sleep. She's otherwise doing well denies any vaginal bleeding. 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 It is slowly improving and is better than last week.  Review of Systems  Constitutional: Feels well.  No fever, chills, early satiety, change in weight or appetite.  Cardiovascular: No chest pain, shortness of breath, or edema.  Pulmonary: No cough or wheeze.  Gastrointestinal: No nausea, vomiting, or diarrhea. No bright red blood per rectum or change in bowel movement.  Genitourinary: No frequency, urgency, or dysuria. No vaginal bleeding or discharge.    Current Meds:  Outpatient Encounter Prescriptions as of 05/11/2013  Medication Sig  . diphenoxylate-atropine (LOMOTIL) 2.5-0.025 MG per tablet Take 2 tablets by mouth 4 (four) times daily as needed for diarrhea or loose stools.  Marland Kitchen oxyCODONE-acetaminophen (PERCOCET/ROXICET) 5-325 MG per  tablet Take 1-2 tablets by mouth every 4 (four) hours as needed for severe pain (moderate to severe pain).  . [DISCONTINUED] ciprofloxacin (CIPRO) 500 MG tablet Take 1 tablet  (500 mg total) by mouth 2 (two) times daily.    Allergy:  Allergies  Allergen Reactions  . Augmentin [Amoxicillin-Pot Clavulanate] Nausea And Vomiting    Can take Keflex  . Sulfa Antibiotics Nausea And Vomiting    Vomiting    Social Hx:   History   Social History  . Marital Status: Married    Spouse Name: N/A    Number of Children: N/A  . Years of Education: N/A   Occupational History  . Not on file.   Social History Main Topics  . Smoking status: Never Smoker   . Smokeless tobacco: Never Used  . Alcohol Use: Yes     Comment: rare social  . Drug Use: No  . Sexual Activity: Yes   Other Topics Concern  . Not on file   Social History Narrative  . No narrative on file    Past Surgical Hx:  Past Surgical History  Procedure Laterality Date  . Dilation and curettage of uterus    . Cervical conization w/bx N/A 03/08/2013    Procedure: CONIZATION CERVIX WITH BIOPSY ;  Surgeon: Cheri Fowler, MD;  Location: [facility];  Service: Gynecology;  Laterality: N/A;  1hr OR time  . Childbirth      x 3 NVD  . Robotic assisted total hysterectomy with bilateral salpingo oopherectomy N/A 04/19/2013    Procedure: ROBOTIC ASSISTED RADICAL HYSTERECTOMY WITH BILATERAL SALPINGECTOMY AND PELVIC LYMPH NODE DISSECTION;  Surgeon: Imagene Gurney A. Alycia Rossetti, MD;  Location: [facility];  Service: Gynecology;  Laterality: N/A;    Past Medical Hx:  Past Medical History  Diagnosis Date  . Cancer     dx. cervical cancer s/p 12'14 cone biopsy  . Stomach problems     "stomach sensitive freq"    Family Hx:  Family History  Problem Relation Age of Onset  . Cancer Mother 13    stage IV  . Cancer Father     prostate  . Hypertension Father   . Stroke Father   . Migraines Brother   . Cancer Brother 40    prostate  . Cancer Maternal Grandmother     breast   . Heart failure Paternal Grandfather     congestive  . Cancer Maternal Uncle     prostate cancer  . Cancer Cousin 40    Maternal colon cancer     Vitals:  Blood pressure 100/83, pulse 85, temperature 97.5 F (36.4 C), temperature source Oral, resp. rate 24, height 5' 3.78" (1.62 m), weight 143 lb 6.4 oz (65.046 kg), last menstrual period 03/24/2013.  Physical Exam:  General: Well developed, well nourished female in no acute distress. Alert and oriented x 3.   Abdomen: Abdomen soft, non-tender and non-obese. Active bowel sounds in all quadrants.  Lap sites healing well.  No drainage or erythema.  Mattress suture removal from left lower quadrant.  Extremities: No bilateral cyanosis, edema, or clubbing.   Pelvic: Normal female genitalia. Speculum examination reveals a suture line to be intact. There is a normal discharge. Bimanual examination reveals postoperative changes. There is no tenderness, fluctuance, or masses.  Assessment/Plan:  42 year old s/p total robotic radical hysterectomy, bilateral salpingectomies, bilateral pelvic lymph node dissection, bilateral oophoropexy on 04/19/2013 for a stage IB 1 adenocarcinoma cervix. She had no residual tumor in her cervix and  negative lymph nodes. She's doing very well postoperatively. She was cautioned regarding doing too much at work and she will try to take easier. Return to see me in 3 weeks for her final postop visit and I will begin surveillance exams.   Lorree Millar A., MD  05/11/2013, 11:40 AM

## 2013-06-02 ENCOUNTER — Encounter: Payer: Self-pay | Admitting: Gynecologic Oncology

## 2013-06-02 ENCOUNTER — Ambulatory Visit: Payer: BC Managed Care – PPO | Attending: Gynecologic Oncology | Admitting: Gynecologic Oncology

## 2013-06-02 VITALS — BP 109/90 | HR 86 | Temp 98.6°F | Resp 16 | Ht 63.78 in | Wt 141.0 lb

## 2013-06-02 DIAGNOSIS — Z90710 Acquired absence of both cervix and uterus: Secondary | ICD-10-CM | POA: Insufficient documentation

## 2013-06-02 DIAGNOSIS — Z79899 Other long term (current) drug therapy: Secondary | ICD-10-CM | POA: Insufficient documentation

## 2013-06-02 DIAGNOSIS — C539 Malignant neoplasm of cervix uteri, unspecified: Secondary | ICD-10-CM | POA: Insufficient documentation

## 2013-06-02 DIAGNOSIS — Z1371 Encounter for nonprocreative screening for genetic disease carrier status: Secondary | ICD-10-CM | POA: Insufficient documentation

## 2013-06-02 NOTE — Patient Instructions (Addendum)
Return to clinic in 3-4 months. 

## 2013-06-02 NOTE — Progress Notes (Signed)
Follow Up Note: Gyn-Onc  Stephanie Johnson 43 y.o. female  CC:  Chief Complaint  Patient presents with  . Endo ca    Folllow up     HPI:  Stephanie Johnson is a 52 year old, gravida 6 aborta 3, who had a pap smear that was normal 2 years ago but she did have high risk HPV. She went to see her gynecologist for her annual Pap smear in September, with her Pap smear revealing CIN-3 with severe dysplasia. She underwent a LEEP on October 28, which revealed CIN-3 with a positive margin. They discussed proceeding with a cold knife conization for definitive treatment and diagnosis. She underwent a cold knife conization on December 16 that revealed:   Diagnosis, Cervix, cone:  - INVASIVE ENDOCERVICAL TYPE ADENOCARCINOMA ARISING IN A BACKGROUND OF ENDOCERVICAL ADENOCARCINOMA IN SITU, SEE COMMENT.  - HIGH GRADE SQUAMOUS INTRAEPITHELIAL LESION, CIN-III (SEVERE DYSPLASIA/CIS) WITH ENDOCERVICAL GLANDULAR EXTENSION  - IN SITU ADENOCARCINOMA PRESENT AT ENDOCERVICAL MARGIN.  - ADENOCARCINOMA IS LESS THAN 1MM FROM DEEP EXCISIONAL MARGIN.   The specimen was submitted to Dr. Annamaria Boots at [facility] to affirm the original diagnosis.  Per Dr. Annamaria Boots, there is indeed endocervical adenocarcinoma, grade 2 out of 3.  Given the presence of tumor at excision margins, the tumor is at least 7 mm wide and has a depth of at least 4 mm. No lymph vascular invasion is identified and severe squamous dysplasia is present.  On 04/19/13, she underwent a total robotic radical hysterectomy, bilateral salpingectomies, bilateral pelvic lymph node dissection, bilateral oophoropexy by Dr. Nancy Marus.  Her post-operative course was uneventful.  Final pathology revealed: 1. Fallopian tube, right - UNREMARKABLE FALLOPIAN TUBE. NO ENDOMETRIOSIS OR MALIGNANCY. 2. Fallopian tube, left - UNREMARKABLE FALLOPIAN TUBE. NO ENDOMETRIOSIS OR MALIGNANCY. 3. Uterus +/- tubes/ovaries, neoplastic, with cervix - CERVIX: SQUAMOUS METAPLASIA AND  ENDOCERVICAL GLANDULAR TUBAL METAPLASIA ASSOCIATED WITH INFLAMMATION AND FIBROSIS. NO RESIDUAL MALIGNANCY IDENTIFIED. - ENDOMETRIUM AND MYOMETRIUM: BENIGN WITH NO EVIDENCE OF MALIGNANCY. 4. Vagina, biopsy, posterior margin - BENIGN SQUAMOUS MUCOSA. NO EVIDENCE OF MALIGNANCY. 5. Lymph nodes, regional resection, right pelvic - THREE BENIGN LYMPH NODES (0/3). 6. Lymph nodes, regional resection, left pelvic - THREE BENIGN LYMPH NODES (0/3).  Interval History:  She presents today for post-operative follow up.  I last saw her on February 18 which time she was doing fairly well. She comes in today for her postoperative check. The biggest issue is that her youngest daughter 61 years old was diagnosed with type 1 diabetes. She was in DKA and was hospitalized for week did not been home for a week. She was doing physically more with her daughter felt a little bit of increased soreness. In retrospect the family has not were diabetes that was previously identified. She states that her left leg feels "weird" and she's walking for prolonged period of time. She has some soreness no numbness. She is getting progressively better. She is due for a mammogram. As stated above she is BRCA negative she'll have annual mammography.  Review of Systems  Constitutional: Feels well.  No fever, chills, early satiety, change in weight or appetite.  Gastrointestinal: No nausea, vomiting, or diarrhea. No bright red blood per rectum or change in bowel movement.  Genitourinary: No frequency, urgency, or dysuria. No vaginal bleeding or discharge.    Current Meds:  Outpatient Encounter Prescriptions as of 06/02/2013  Medication Sig  . diphenoxylate-atropine (LOMOTIL) 2.5-0.025 MG per tablet Take 2 tablets by mouth 4 (four) times daily  as needed for diarrhea or loose stools.  Marland Kitchen oxyCODONE-acetaminophen (PERCOCET/ROXICET) 5-325 MG per tablet Take 1-2 tablets by mouth every 4 (four) hours as needed for severe pain (moderate to severe pain).     Allergy:  Allergies  Allergen Reactions  . Augmentin [Amoxicillin-Pot Clavulanate] Nausea And Vomiting    Can take Keflex  . Sulfa Antibiotics Nausea And Vomiting    Vomiting    Social Hx:   History   Social History  . Marital Status: Married    Spouse Name: N/A    Number of Children: N/A  . Years of Education: N/A   Occupational History  . Not on file.   Social History Main Topics  . Smoking status: Never Smoker   . Smokeless tobacco: Never Used  . Alcohol Use: Yes     Comment: rare social  . Drug Use: No  . Sexual Activity: Yes   Other Topics Concern  . Not on file   Social History Narrative  . No narrative on file    Past Surgical Hx:  Past Surgical History  Procedure Laterality Date  . Dilation and curettage of uterus    . Cervical conization w/bx N/A 03/08/2013    Procedure: CONIZATION CERVIX WITH BIOPSY ;  Surgeon: Cheri Fowler, MD;  Location: [facility];  Service: Gynecology;  Laterality: N/A;  1hr OR time  . Childbirth      x 3 NVD  . Robotic assisted total hysterectomy with bilateral salpingo oopherectomy N/A 04/19/2013    Procedure: ROBOTIC ASSISTED RADICAL HYSTERECTOMY WITH BILATERAL SALPINGECTOMY AND PELVIC LYMPH NODE DISSECTION;  Surgeon: Imagene Gurney A. Alycia Rossetti, MD;  Location: [facility];  Service: Gynecology;  Laterality: N/A;    Past Medical Hx:  Past Medical History  Diagnosis Date  . Cancer     dx. cervical cancer s/p 12'14 cone biopsy  . Stomach problems     "stomach sensitive freq"    Family Hx:  Family History  Problem Relation Age of Onset  . Cancer Mother 53    stage IV  . Cancer Father     prostate  . Hypertension Father   . Stroke Father   . Migraines Brother   . Cancer Brother 40    prostate  . Cancer Maternal Grandmother     breast   . Heart failure Paternal Grandfather     congestive  . Cancer Maternal Uncle     prostate cancer  . Cancer Cousin 40    Maternal colon cancer    Vitals:  Blood pressure 109/90, pulse 86,  temperature 98.6 F (37 C), temperature source Oral, resp. rate 16, height 5' 3.78" (1.62 m), weight 141 lb (63.957 kg).  Physical Exam:  General: Well developed, well nourished female in no acute distress. Alert and oriented x 3.   Abdomen: Abdomen soft, non-tender and non-obese. Active bowel sounds in all quadrants.  Lap sites healing well.  No drainage or erythema.    Extremities: No bilateral cyanosis, edema, or clubbing.   Pelvic: Normal female genitalia. Speculum examination reveals a suture line to be intact. Bimanual examination reveals postoperative changes. There is no tenderness, fluctuance, or masses.  Assessment/Plan:  43 year old s/p total robotic radical hysterectomy, bilateral salpingectomies, bilateral pelvic lymph node dissection, bilateral oophoropexy on 04/19/2013 for a stage IB 1 adenocarcinoma cervix. She had no residual tumor in her cervix and negative lymph nodes. She's doing very well postoperatively. She was cautioned regarding doing too much at work and she will try to take  easier. Return to see me in 3-4 months for her first surveillance exam.   Falynn Ailey A., MD  06/02/2013, 10:22 AM

## 2013-08-04 ENCOUNTER — Ambulatory Visit: Payer: BC Managed Care – PPO | Admitting: Gynecologic Oncology

## 2013-08-16 ENCOUNTER — Encounter (HOSPITAL_COMMUNITY): Payer: Self-pay

## 2013-09-08 ENCOUNTER — Other Ambulatory Visit (HOSPITAL_COMMUNITY)
Admission: RE | Admit: 2013-09-08 | Discharge: 2013-09-08 | Disposition: A | Discharge status: Home / Self Care | Payer: BC Managed Care – PPO | Source: Ambulatory Visit | Attending: Gynecologic Oncology | Admitting: Gynecologic Oncology

## 2013-09-08 ENCOUNTER — Ambulatory Visit: Payer: BC Managed Care – PPO | Attending: Gynecologic Oncology | Admitting: Gynecologic Oncology

## 2013-09-08 ENCOUNTER — Encounter: Payer: Self-pay | Admitting: Gynecologic Oncology

## 2013-09-08 VITALS — BP 110/68 | HR 73 | Temp 97.5°F | Resp 18 | Ht 63.78 in | Wt 146.7 lb

## 2013-09-08 DIAGNOSIS — Z79899 Other long term (current) drug therapy: Secondary | ICD-10-CM | POA: Insufficient documentation

## 2013-09-08 DIAGNOSIS — Z8541 Personal history of malignant neoplasm of cervix uteri: Secondary | ICD-10-CM

## 2013-09-08 DIAGNOSIS — Z90710 Acquired absence of both cervix and uterus: Secondary | ICD-10-CM | POA: Insufficient documentation

## 2013-09-08 DIAGNOSIS — C539 Malignant neoplasm of cervix uteri, unspecified: Secondary | ICD-10-CM

## 2013-09-08 DIAGNOSIS — Z01419 Encounter for gynecological examination (general) (routine) without abnormal findings: Secondary | ICD-10-CM | POA: Insufficient documentation

## 2013-09-08 DIAGNOSIS — D069 Carcinoma in situ of cervix, unspecified: Secondary | ICD-10-CM | POA: Insufficient documentation

## 2013-09-08 NOTE — Progress Notes (Signed)
Follow Up Note: Gyn-Onc  Stephanie Johnson 43 y.o. female  CC:  Chief Complaint  Patient presents with  . Cervical Cancer    Follow up    HPI:  Stephanie Johnson is a 49 year old, gravida 6 aborta 3, who had a pap smear that was normal 2 years ago but she did have high risk HPV. She went to see her gynecologist for her annual Pap smear in September, with her Pap smear revealing CIN-3 with severe dysplasia. She underwent a LEEP on October 28, which revealed CIN-3 with a positive margin. They discussed proceeding with a cold knife conization for definitive treatment and diagnosis. She underwent a cold knife conization on December 16 that revealed:   Diagnosis, Cervix, cone:  - INVASIVE ENDOCERVICAL TYPE ADENOCARCINOMA ARISING IN A BACKGROUND OF ENDOCERVICAL ADENOCARCINOMA IN SITU, SEE COMMENT.  - HIGH GRADE SQUAMOUS INTRAEPITHELIAL LESION, CIN-III (SEVERE DYSPLASIA/CIS) WITH ENDOCERVICAL GLANDULAR EXTENSION  - IN SITU ADENOCARCINOMA PRESENT AT ENDOCERVICAL MARGIN.  - ADENOCARCINOMA IS LESS THAN 1MM FROM DEEP EXCISIONAL MARGIN.   The specimen was submitted to Dr. Annamaria Boots at [facility] to affirm the original diagnosis.  Per Dr. Annamaria Boots, there is indeed endocervical adenocarcinoma, grade 2 out of 3.  Given the presence of tumor at excision margins, the tumor is at least 7 mm wide and has a depth of at least 4 mm. No lymph vascular invasion is identified and severe squamous dysplasia is present.  On 04/19/13, she underwent a total robotic radical hysterectomy, bilateral salpingectomies, bilateral pelvic lymph node dissection, bilateral oophoropexy.  Her post-operative course was uneventful.  Final pathology revealed: 1. Fallopian tube, right - UNREMARKABLE FALLOPIAN TUBE. NO ENDOMETRIOSIS OR MALIGNANCY. 2. Fallopian tube, left - UNREMARKABLE FALLOPIAN TUBE. NO ENDOMETRIOSIS OR MALIGNANCY. 3. Uterus +/- tubes/ovaries, neoplastic, with cervix - CERVIX: SQUAMOUS METAPLASIA AND ENDOCERVICAL GLANDULAR  TUBAL METAPLASIA ASSOCIATED WITH INFLAMMATION AND FIBROSIS. NO RESIDUAL MALIGNANCY IDENTIFIED. - ENDOMETRIUM AND MYOMETRIUM: BENIGN WITH NO EVIDENCE OF MALIGNANCY. 4. Vagina, biopsy, posterior margin - BENIGN SQUAMOUS MUCOSA. NO EVIDENCE OF MALIGNANCY. 5. Lymph nodes, regional resection, right pelvic - THREE BENIGN LYMPH NODES (0/3). 6. Lymph nodes, regional resection, left pelvic - THREE BENIGN LYMPH NODES (0/3).  Interval History:   She is overall doing very well. Review of systems is completely negative. The continuing to struggle with issues with her daughter and her diabetes. She is N/A be getting her mammogram soon. Of note she is BRCA negative. The biggest issue of review of systems she feels very well her work is occasional she still feels that there is some weakness in the left leg. It does not bother her on a daily basis that she does notice that when she feels that she's doing "extreme" activities. She is sexually active and denies any pain. She denies any change in bowel or  bladder habits. She denies any lower abdominal or pelvic pain. She denies any vaginal bleeding or discharge.  Review of Systems   Constitutional:  Denies fever. Skin: No rash, sores, jaundice, itching, or dryness.  Cardiovascular: No chest pain, shortness of breath, or edema  Pulmonary: No cough or wheeze.  Gastro Intestinal:  No nausea, vomiting, constipation, or diarrhea reported. No bright red blood per rectum or change in bowel movement.  Genitourinary: No frequency, urgency, or dysuria.  Denies vaginal bleeding and discharge.  Musculoskeletal: No myalgia, arthralgia, joint swelling or pain.  Neurologic: No weakness, numbness, or change in gait. + "weird" left leg Psychology: Feeling well   Current Meds:  Outpatient Encounter Prescriptions as of 09/08/2013  Medication Sig  . diphenoxylate-atropine (LOMOTIL) 2.5-0.025 MG per tablet Take 2 tablets by mouth 4 (four) times daily as needed for diarrhea or loose  stools.  Marland Kitchen oxyCODONE-acetaminophen (PERCOCET/ROXICET) 5-325 MG per tablet Take 1-2 tablets by mouth every 4 (four) hours as needed for severe pain (moderate to severe pain).    Allergy:  Allergies  Allergen Reactions  . Augmentin [Amoxicillin-Pot Clavulanate] Nausea And Vomiting    Can take Keflex  . Sulfa Antibiotics Nausea And Vomiting    Vomiting    Social Hx:   History   Social History  . Marital Status: Married    Spouse Name: N/A    Number of Children: N/A  . Years of Education: N/A   Occupational History  . Not on file.   Social History Main Topics  . Smoking status: Never Smoker   . Smokeless tobacco: Never Used  . Alcohol Use: Yes     Comment: rare social  . Drug Use: No  . Sexual Activity: Yes   Other Topics Concern  . Not on file   Social History Narrative  . No narrative on file    Past Surgical Hx:  Past Surgical History  Procedure Laterality Date  . Dilation and curettage of uterus    . Cervical conization w/bx N/A 03/08/2013    Procedure: CONIZATION CERVIX WITH BIOPSY ;  Surgeon: Cheri Fowler, MD;  Location: [facility];  Service: Gynecology;  Laterality: N/A;  1hr OR time  . Childbirth      x 3 NVD  . Robotic assisted total hysterectomy with bilateral salpingo oopherectomy N/A 04/19/2013    Procedure: ROBOTIC ASSISTED RADICAL HYSTERECTOMY WITH BILATERAL SALPINGECTOMY AND PELVIC LYMPH NODE DISSECTION;  Surgeon: Imagene Gurney A. Alycia Rossetti, MD;  Location: [facility];  Service: Gynecology;  Laterality: N/A;    Past Medical Hx:  Past Medical History  Diagnosis Date  . Cancer     dx. cervical cancer s/p 12'14 cone biopsy  . Stomach problems     "stomach sensitive freq"    Family Hx:  Family History  Problem Relation Age of Onset  . Cancer Mother 70    stage IV  . Cancer Father     prostate  . Hypertension Father   . Stroke Father   . Migraines Brother   . Cancer Brother 40    prostate  . Cancer Maternal Grandmother     breast   . Heart failure  Paternal Grandfather     congestive  . Cancer Maternal Uncle     prostate cancer  . Cancer Cousin 40    Maternal colon cancer    Vitals:  Blood pressure 110/68, pulse 73, temperature 97.5 F (36.4 C), temperature source Oral, resp. rate 18, height 5' 3.78" (1.62 m), weight 146 lb 11.2 oz (66.543 kg).  Physical Exam:  General: Well developed, well nourished female in no acute distress. Alert and oriented x 3.   Neck: Supple, no lymphadenopathy, no thyromegaly.  Lungs: Clear to auscultation bilaterally.  Cardiovascular: Regular rate and rhythm  Abdomen: Abdomen soft, non-tender and non-obese. Well-healed surgical incision  Extremities: No bilateral cyanosis, edema, or clubbing.   Pelvic: Normal female genitalia. Speculum examination reveals a normal-appearing vaginal cuff. There are no visible lesions. Pap smear submitted without difficulty.  There is no tenderness, fluctuance, or masses. Rectal confirms  Assessment/Plan:  43 year old s/p total robotic radical hysterectomy, bilateral salpingectomies, bilateral pelvic lymph node dissection, bilateral oophoropexy on 04/19/2013 for  a stage IB 1 adenocarcinoma cervix. She had no residual tumor in her cervix and negative lymph nodes.  We'll followup in results for Pap smear from today. Return to see me in 3 months. She will followup with Dr. Paulene Floor office for her mammogram.   Nancy Marus A., MD  09/08/2013, 11:27 AM

## 2013-09-08 NOTE — Patient Instructions (Addendum)
Return to see Dr. Alycia Rossetti in 3 months. We will notify you of the results of your Pap smear

## 2013-09-13 LAB — CYTOLOGY - PAP

## 2013-09-14 ENCOUNTER — Telehealth: Payer: Self-pay | Admitting: *Deleted

## 2013-09-14 NOTE — Telephone Encounter (Signed)
Per MD notified pt of results.pt verbalized understanding no further concerns.

## 2013-09-14 NOTE — Telephone Encounter (Signed)
Message copied by Lucile Crater on Wed Sep 14, 2013  2:32 PM ------      Message from: Nancy Marus      Created: Tue Sep 13, 2013  5:37 PM       Please call her with her normal pap smear results.      PG      ----- Message -----         From: [facility] Interface         Sent: 09/13/2013   2:42 PM           To: Imagene Gurney A. Alycia Rossetti, MD                   ------

## 2013-12-29 ENCOUNTER — Other Ambulatory Visit (HOSPITAL_COMMUNITY)
Admission: RE | Admit: 2013-12-29 | Discharge: 2013-12-29 | Disposition: A | Discharge status: Home / Self Care | Payer: BC Managed Care – PPO | Source: Ambulatory Visit | Attending: Gynecologic Oncology | Admitting: Gynecologic Oncology

## 2013-12-29 ENCOUNTER — Ambulatory Visit: Payer: BC Managed Care – PPO | Admitting: Gynecologic Oncology

## 2013-12-29 ENCOUNTER — Encounter: Payer: Self-pay | Admitting: Gynecologic Oncology

## 2013-12-29 ENCOUNTER — Ambulatory Visit: Payer: BC Managed Care – PPO | Attending: Gynecologic Oncology | Admitting: Gynecologic Oncology

## 2013-12-29 VITALS — BP 104/72 | HR 89 | Temp 98.3°F | Resp 22 | Ht 63.75 in | Wt 144.6 lb

## 2013-12-29 DIAGNOSIS — Z8042 Family history of malignant neoplasm of prostate: Secondary | ICD-10-CM | POA: Insufficient documentation

## 2013-12-29 DIAGNOSIS — Z90722 Acquired absence of ovaries, bilateral: Secondary | ICD-10-CM | POA: Diagnosis not present

## 2013-12-29 DIAGNOSIS — Z803 Family history of malignant neoplasm of breast: Secondary | ICD-10-CM | POA: Insufficient documentation

## 2013-12-29 DIAGNOSIS — Z01411 Encounter for gynecological examination (general) (routine) with abnormal findings: Secondary | ICD-10-CM | POA: Diagnosis not present

## 2013-12-29 DIAGNOSIS — C539 Malignant neoplasm of cervix uteri, unspecified: Secondary | ICD-10-CM

## 2013-12-29 DIAGNOSIS — Z800 Family history of malignant neoplasm of digestive organs: Secondary | ICD-10-CM | POA: Diagnosis not present

## 2013-12-29 DIAGNOSIS — Z90710 Acquired absence of both cervix and uterus: Secondary | ICD-10-CM | POA: Diagnosis not present

## 2013-12-29 NOTE — Patient Instructions (Signed)
We will notify you of the results of your Pap smear. Please return to see [place] in 3-4 months.

## 2013-12-29 NOTE — Progress Notes (Signed)
Follow Up Note: Gyn-Onc  Stephanie Johnson 43 y.o. female  CC:  No chief complaint on file.   HPI:  Stephanie Johnson is a 66 year old, gravida 6 aborta 3, who had a pap smear that was normal 2 years ago but she did have high risk HPV. She went to see her gynecologist for her annual Pap smear in September, with her Pap smear revealing CIN-3 with severe dysplasia. She underwent a LEEP on October 28, which revealed CIN-3 with a positive margin. They discussed proceeding with a cold knife conization for definitive treatment and diagnosis. She underwent a cold knife conization on December 16 that revealed:   Diagnosis, Cervix, cone:  - INVASIVE ENDOCERVICAL TYPE ADENOCARCINOMA ARISING IN A BACKGROUND OF ENDOCERVICAL ADENOCARCINOMA IN SITU, SEE COMMENT.  - HIGH GRADE SQUAMOUS INTRAEPITHELIAL LESION, CIN-III (SEVERE DYSPLASIA/CIS) WITH ENDOCERVICAL GLANDULAR EXTENSION  - IN SITU ADENOCARCINOMA PRESENT AT ENDOCERVICAL MARGIN.  - ADENOCARCINOMA IS LESS THAN 1MM FROM DEEP EXCISIONAL MARGIN.   The specimen was submitted to Dr. Annamaria Boots at [facility] to affirm the original diagnosis.  Per Dr. Annamaria Boots, there is indeed endocervical adenocarcinoma, grade 2 out of 3.  Given the presence of tumor at excision margins, the tumor is at least 7 mm wide and has a depth of at least 4 mm. No lymph vascular invasion is identified and severe squamous dysplasia is present.  On 04/19/13, she underwent a total robotic radical hysterectomy, bilateral salpingectomies, bilateral pelvic lymph node dissection, bilateral oophoropexy.  Her post-operative course was uneventful.  Final pathology revealed: 1. Fallopian tube, right - UNREMARKABLE FALLOPIAN TUBE. NO ENDOMETRIOSIS OR MALIGNANCY. 2. Fallopian tube, left - UNREMARKABLE FALLOPIAN TUBE. NO ENDOMETRIOSIS OR MALIGNANCY. 3. Uterus +/- tubes/ovaries, neoplastic, with cervix - CERVIX: SQUAMOUS METAPLASIA AND ENDOCERVICAL GLANDULAR TUBAL METAPLASIA ASSOCIATED WITH INFLAMMATION AND  FIBROSIS. NO RESIDUAL MALIGNANCY IDENTIFIED. - ENDOMETRIUM AND MYOMETRIUM: BENIGN WITH NO EVIDENCE OF MALIGNANCY. 4. Vagina, biopsy, posterior margin - BENIGN SQUAMOUS MUCOSA. NO EVIDENCE OF MALIGNANCY. 5. Lymph nodes, regional resection, right pelvic - THREE BENIGN LYMPH NODES (0/3). 6. Lymph nodes, regional resection, left pelvic - THREE BENIGN LYMPH NODES (0/3).  Interval History:   I last saw her in June. That time her exam was negative as was her Pap smear. She is overall doing very well. Review of systems is completely negative. The continuing to struggle with issues with her daughter and her diabetes. Of note she is BRCA negative.   Review of Systems   Constitutional:  Denies fever. Skin: No rash Cardiovascular: No chest pain, shortness of breath Pulmonary: No cough  Gastro Intestinal:  No change in bowel or bladder habits Genitourinary: No frequency, urgency, or dysuria.  Denies vaginal bleeding and discharge. No dyspareunia Neurologic: No weakness, numbness, or change in gait. Left leg feels "totally normal" Psychology: Feeling well   Current Meds:  Outpatient Encounter Prescriptions as of 12/29/2013  Medication Sig  . diphenoxylate-atropine (LOMOTIL) 2.5-0.025 MG per tablet Take 2 tablets by mouth 4 (four) times daily as needed for diarrhea or loose stools.  Marland Kitchen oxyCODONE-acetaminophen (PERCOCET/ROXICET) 5-325 MG per tablet Take 1-2 tablets by mouth every 4 (four) hours as needed for severe pain (moderate to severe pain).    Allergy:  Allergies  Allergen Reactions  . Augmentin [Amoxicillin-Pot Clavulanate] Nausea And Vomiting    Can take Keflex  . Sulfa Antibiotics Nausea And Vomiting    Vomiting    Social Hx:   History   Social History  . Marital Status: Married  Spouse Name: N/A    Number of Children: N/A  . Years of Education: N/A   Occupational History  . Not on file.   Social History Main Topics  . Smoking status: Never Smoker   . Smokeless tobacco: Never  Used  . Alcohol Use: Yes     Comment: rare social  . Drug Use: No  . Sexual Activity: Yes   Other Topics Concern  . Not on file   Social History Narrative  . No narrative on file    Past Surgical Hx:  Past Surgical History  Procedure Laterality Date  . Dilation and curettage of uterus    . Cervical conization w/bx N/A 03/08/2013    Procedure: CONIZATION CERVIX WITH BIOPSY ;  Surgeon: Cheri Fowler, MD;  Location: [facility];  Service: Gynecology;  Laterality: N/A;  1hr OR time  . Childbirth      x 3 NVD  . Robotic assisted total hysterectomy with bilateral salpingo oopherectomy N/A 04/19/2013    Procedure: ROBOTIC ASSISTED RADICAL HYSTERECTOMY WITH BILATERAL SALPINGECTOMY AND PELVIC LYMPH NODE DISSECTION;  Surgeon: Imagene Gurney A. Alycia Rossetti, MD;  Location: [facility];  Service: Gynecology;  Laterality: N/A;    Past Medical Hx:  Past Medical History  Diagnosis Date  . Cancer     dx. cervical cancer s/p 12'14 cone biopsy  . Stomach problems     "stomach sensitive freq"    Family Hx:  Family History  Problem Relation Age of Onset  . Cancer Mother 71    stage IV  . Cancer Father     prostate  . Hypertension Father   . Stroke Father   . Migraines Brother   . Cancer Brother 40    prostate  . Cancer Maternal Grandmother     breast   . Heart failure Paternal Grandfather     congestive  . Cancer Maternal Uncle     prostate cancer  . Cancer Cousin 40    Maternal colon cancer    Vitals:  Blood pressure 104/72, pulse 89, temperature 98.3 F (36.8 C), temperature source Oral, resp. rate 22, height 5' 3.75" (1.619 m), weight 144 lb 9.6 oz (65.59 kg).  Physical Exam:  General: Well developed, well nourished female in no acute distress. Alert and oriented x 3.   Neck: Supple, no lymphadenopathy, no thyromegaly.  Lungs: Clear to auscultation bilaterally.  Cardiovascular: Regular rate and rhythm  Abdomen: Abdomen soft, non-tender and non-obese. Well-healed surgical  incision  Extremities: No bilateral cyanosis, edema, or clubbing.   Pelvic: Normal female genitalia. Speculum examination reveals a normal-appearing vaginal cuff. There are no visible lesions. Pap smear submitted without difficulty.  There is no tenderness, fluctuance, nodularity or masses. Rectal confirms  Assessment/Plan:  43 year old s/p total robotic radical hysterectomy, bilateral salpingectomies, bilateral pelvic lymph node dissection, bilateral oophoropexy on 04/19/2013 for a stage IB 1 adenocarcinoma cervix. She had no residual tumor in her cervix and negative lymph nodes.  We'll followup in results for Pap smear from today. Return to see me in 3 months.    Athanasius Kesling A., MD  12/29/2013, 10:22 AM

## 2014-01-03 LAB — CYTOLOGY - PAP

## 2014-01-05 ENCOUNTER — Telehealth: Payer: Self-pay | Admitting: Gynecologic Oncology

## 2014-01-05 NOTE — Telephone Encounter (Signed)
Message left about pap results: negative.  Advised to call for any questions or concerns.

## 2014-03-08 ENCOUNTER — Ambulatory Visit: Payer: BC Managed Care – PPO | Admitting: Gynecologic Oncology

## 2014-07-31 ENCOUNTER — Emergency Department (HOSPITAL_COMMUNITY): Payer: BC Managed Care – PPO

## 2014-07-31 ENCOUNTER — Encounter (HOSPITAL_COMMUNITY): Payer: Self-pay | Admitting: Emergency Medicine

## 2014-07-31 ENCOUNTER — Emergency Department (HOSPITAL_COMMUNITY)
Admission: EM | Admit: 2014-07-31 | Discharge: 2014-07-31 | Disposition: A | Discharge status: Home / Self Care | Payer: BC Managed Care – PPO | Attending: Emergency Medicine | Admitting: Emergency Medicine

## 2014-07-31 DIAGNOSIS — Z8541 Personal history of malignant neoplasm of cervix uteri: Secondary | ICD-10-CM | POA: Insufficient documentation

## 2014-07-31 DIAGNOSIS — Z8719 Personal history of other diseases of the digestive system: Secondary | ICD-10-CM | POA: Diagnosis not present

## 2014-07-31 DIAGNOSIS — N200 Calculus of kidney: Secondary | ICD-10-CM | POA: Diagnosis not present

## 2014-07-31 DIAGNOSIS — I959 Hypotension, unspecified: Secondary | ICD-10-CM | POA: Diagnosis not present

## 2014-07-31 DIAGNOSIS — R109 Unspecified abdominal pain: Secondary | ICD-10-CM | POA: Diagnosis present

## 2014-07-31 LAB — I-STAT CHEM 8, ED
BUN: 13 mg/dL (ref 6–20)
CALCIUM ION: 1.2 mmol/L (ref 1.12–1.23)
CREATININE: 0.8 mg/dL (ref 0.44–1.00)
Chloride: 103 mmol/L (ref 101–111)
Glucose, Bld: 127 mg/dL — ABNORMAL HIGH (ref 70–99)
HCT: 38 % (ref 36.0–46.0)
Hemoglobin: 12.9 g/dL (ref 12.0–15.0)
Potassium: 3.5 mmol/L (ref 3.5–5.1)
Sodium: 141 mmol/L (ref 135–145)
TCO2: 20 mmol/L (ref 0–100)

## 2014-07-31 LAB — CBC
HCT: 37.9 % (ref 36.0–46.0)
Hemoglobin: 12.9 g/dL (ref 12.0–15.0)
MCH: 30.6 pg (ref 26.0–34.0)
MCHC: 34 g/dL (ref 30.0–36.0)
MCV: 89.8 fL (ref 78.0–100.0)
Platelets: 187 10*3/uL (ref 150–400)
RBC: 4.22 MIL/uL (ref 3.87–5.11)
RDW: 12.8 % (ref 11.5–15.5)
WBC: 7.8 10*3/uL (ref 4.0–10.5)

## 2014-07-31 LAB — URINALYSIS, ROUTINE W REFLEX MICROSCOPIC
Bilirubin Urine: NEGATIVE
GLUCOSE, UA: NEGATIVE mg/dL
Hgb urine dipstick: NEGATIVE
KETONES UR: NEGATIVE mg/dL
Leukocytes, UA: NEGATIVE
Nitrite: NEGATIVE
PROTEIN: NEGATIVE mg/dL
Specific Gravity, Urine: >1.03 — ABNORMAL HIGH (ref 1.005–1.030)
UROBILINOGEN UA: 0.2 mg/dL (ref 0.0–1.0)
pH: 5.5 (ref 5.0–8.0)

## 2014-07-31 LAB — HEPATIC FUNCTION PANEL
ALBUMIN: 4.2 g/dL (ref 3.5–5.0)
ALT: 16 U/L (ref 14–54)
AST: 24 U/L (ref 15–41)
Alkaline Phosphatase: 52 U/L (ref 38–126)
BILIRUBIN TOTAL: 0.7 mg/dL (ref 0.3–1.2)
Bilirubin, Direct: <0.1 mg/dL — ABNORMAL LOW (ref 0.1–0.5)
Total Protein: 7.2 g/dL (ref 6.5–8.1)

## 2014-07-31 LAB — LIPASE, BLOOD: LIPASE: 21 U/L — AB (ref 22–51)

## 2014-07-31 MED ORDER — KETOROLAC TROMETHAMINE 30 MG/ML IJ SOLN
30.0000 mg | Freq: Once | INTRAMUSCULAR | Status: AC
  Administered 2014-07-31: 30 mg via INTRAVENOUS

## 2014-07-31 MED ORDER — HYDROMORPHONE HCL 1 MG/ML IJ SOLN
0.5000 mg | Freq: Once | INTRAMUSCULAR | Status: AC
  Administered 2014-07-31: 0.5 mg via INTRAVENOUS

## 2014-07-31 MED ORDER — HYDROMORPHONE HCL 1 MG/ML IJ SOLN
0.5000 mg | Freq: Once | INTRAMUSCULAR | Status: DC
  Filled 2014-07-31: qty 1

## 2014-07-31 MED ORDER — KETOROLAC TROMETHAMINE 30 MG/ML IJ SOLN
30.0000 mg | Freq: Once | INTRAMUSCULAR | Status: DC
  Filled 2014-07-31: qty 1

## 2014-07-31 MED ORDER — IBUPROFEN 800 MG PO TABS: 800.0000 mg | ORAL_TABLET | Freq: Three times a day (TID) | ORAL | Status: DC

## 2014-07-31 MED ORDER — TAMSULOSIN HCL 0.4 MG PO CAPS: 0.4000 mg | ORAL_CAPSULE | Freq: Two times a day (BID) | ORAL | Status: DC

## 2014-07-31 MED ORDER — ONDANSETRON 4 MG PO TBDP: 4.0000 mg | ORAL_TABLET | Freq: Three times a day (TID) | ORAL | Status: DC | PRN

## 2014-07-31 MED ORDER — OXYCODONE-ACETAMINOPHEN 5-325 MG PO TABS: 1.0000 | ORAL_TABLET | ORAL | Status: DC | PRN

## 2014-07-31 NOTE — ED Provider Notes (Signed)
CSN: [id number]     Arrival date & time 07/31/14  0716 History   First MD Initiated Contact with Patient 07/31/14 0720     Chief Complaint  Patient presents with  . Flank Pain  . Abdominal Pain     (Consider location/radiation/quality/duration/timing/severity/associated sxs/prior Treatment) HPI Comments: The patient is a 44 year old female, she has a prior history of cervical cancer, she had robotic-assisted hysterectomy, she presents today with acute onset of left-sided flank pain, she has had intermittent diarrhea over the last 24 hours described as both loose and watery, no vomiting though she is nauseated this morning, the pain is on the left side, left abdomen in the lower quadrant with associated left flank pain, sharp and stabbing, colicky, no comfortable position, no change in urine, no fevers. Nothing makes this better or worse, no medications given prior to arrival.  Patient is a 44 y.o. female presenting with flank pain and abdominal pain. The history is provided by the patient.  Flank Pain Associated symptoms include abdominal pain.  Abdominal Pain   Past Medical History  Diagnosis Date  . Cancer     dx. cervical cancer s/p 12'14 cone biopsy  . Stomach problems     "stomach sensitive freq"   Past Surgical History  Procedure Laterality Date  . Dilation and curettage of uterus    . Cervical conization w/bx N/A 03/08/2013    Procedure: CONIZATION CERVIX WITH BIOPSY ;  Surgeon: Cheri Fowler, MD;  Location: [facility];  Service: Gynecology;  Laterality: N/A;  1hr OR time  . Childbirth      x 3 NVD  . Robotic assisted total hysterectomy with bilateral salpingo oopherectomy N/A 04/19/2013    Procedure: ROBOTIC ASSISTED RADICAL HYSTERECTOMY WITH BILATERAL SALPINGECTOMY AND PELVIC LYMPH NODE DISSECTION;  Surgeon: Imagene Gurney A. Alycia Rossetti, MD;  Location: [facility];  Service: Gynecology;  Laterality: N/A;   Family History  Problem Relation Age of Onset  . Cancer Mother 30    stage IV  .  Cancer Father     prostate  . Hypertension Father   . Stroke Father   . Migraines Brother   . Cancer Brother 40    prostate  . Cancer Maternal Grandmother     breast   . Heart failure Paternal Grandfather     congestive  . Cancer Maternal Uncle     prostate cancer  . Cancer Cousin 40    Maternal colon cancer   History  Substance Use Topics  . Smoking status: Never Smoker   . Smokeless tobacco: Never Used  . Alcohol Use: Yes     Comment: rare social   OB History    No data available     Review of Systems  Gastrointestinal: Positive for abdominal pain.  Genitourinary: Positive for flank pain.  All other systems reviewed and are negative.     Allergies  Augmentin and Sulfa antibiotics  Home Medications   Prior to Admission medications   Medication Sig Start Date End Date Taking? Authorizing Provider  ibuprofen (ADVIL,MOTRIN) 800 MG tablet Take 1 tablet (800 mg total) by mouth 3 (three) times daily. 07/31/14   Noemi Chapel, MD  ondansetron (ZOFRAN ODT) 4 MG disintegrating tablet Take 1 tablet (4 mg total) by mouth every 8 (eight) hours as needed for nausea. 07/31/14   Noemi Chapel, MD  oxyCODONE-acetaminophen (PERCOCET) 5-325 MG per tablet Take 1 tablet by mouth every 4 (four) hours as needed. 07/31/14   Noemi Chapel, MD  tamsulosin (FLOMAX) 0.4 MG  CAPS capsule Take 1 capsule (0.4 mg total) by mouth 2 (two) times daily. 07/31/14   Noemi Chapel, MD   BP 106/67 mmHg  Pulse 65  Temp(Src) 97.5 F (36.4 C) (Oral)  Resp 20  SpO2 95%  LMP 03/24/2013 Physical Exam  Constitutional: She appears well-developed and well-nourished. No distress.  HENT:  Head: Normocephalic and atraumatic.  Mouth/Throat: Oropharynx is clear and moist. No oropharyngeal exudate.  Eyes: Conjunctivae and EOM are normal. Pupils are equal, round, and reactive to light. Right eye exhibits no discharge. Left eye exhibits no discharge. No scleral icterus.  Neck: Normal range of motion. Neck supple. No JVD  present. No thyromegaly present.  Cardiovascular: Normal rate, regular rhythm, normal heart sounds and intact distal pulses.  Exam reveals no gallop and no friction rub.   No murmur heard. Pulmonary/Chest: Effort normal and breath sounds normal. No respiratory distress. She has no wheezes. She has no rales.  Abdominal: Soft. Bowel sounds are normal. She exhibits no distension and no mass. There is tenderness ( Minimal reproducible left lower quadrant and left mid abdominal tenderness, left CVA tenderness present).  Musculoskeletal: Normal range of motion. She exhibits no edema or tenderness.  Lymphadenopathy:    She has no cervical adenopathy.  Neurological: She is alert. Coordination normal.  Skin: Skin is warm and dry. No rash noted. No erythema.  Psychiatric: She has a normal mood and affect. Her behavior is normal.  Nursing note and vitals reviewed.   ED Course  Procedures (including critical care time) Labs Review Labs Reviewed  URINALYSIS, ROUTINE W REFLEX MICROSCOPIC - Abnormal; Notable for the following:    APPearance CLOUDY (*)    Specific Gravity, Urine >1.030 (*)    All other components within normal limits  LIPASE, BLOOD - Abnormal; Notable for the following:    Lipase 21 (*)    All other components within normal limits  HEPATIC FUNCTION PANEL - Abnormal; Notable for the following:    Bilirubin, Direct <0.1 (*)    All other components within normal limits  I-STAT CHEM 8, ED - Abnormal; Notable for the following:    Glucose, Bld 127 (*)    All other components within normal limits  CBC    Imaging Review Ct Abdomen Pelvis Wo Contrast  07/31/2014   CLINICAL DATA:  Left flank pain with nausea and vomiting for 1 day. History of cervical carcinoma.  EXAM: CT ABDOMEN AND PELVIS WITHOUT CONTRAST  TECHNIQUE: Multidetector CT imaging of the abdomen and pelvis was performed following the standard protocol without oral or intravenous contrast material administration.  COMPARISON:   PET-CT April 14, 2013  FINDINGS: Lung bases are clear.  No focal liver lesions are identified on this noncontrast enhanced study. Gallbladder wall is not appreciably thickened. There is no biliary duct dilatation.  Spleen, pancreas, and adrenals appear normal.  There is no renal mass on either side. There is a junctional parenchymal defect in the left kidney, an anatomic variant. There is slight hydronephrosis on the left. There is no hydronephrosis on the right. There is a 2 mm calculus in the lower pole the left kidney, nonobstructing. There is a tiny nonobstructing calculus in the right kidney posteriorly. There is no ureteral calculus on the right. On the left, however, there is a 2 mm calculus at the left ureterovesical junction.  In the pelvis, the urinary bladder is midline with normal wall thickness. Uterus is absent. There is no pelvic mass or pelvic fluid collection. The appendix is been  removed. There is no periappendiceal region inflammation.  There is no bowel obstruction. No free air or portal venous air. There is no demonstrable ascites, adenopathy, or abscess in the abdomen or pelvis. There is atherosclerotic change in the aorta but no aneurysm. There are no blastic or lytic bone lesions.  IMPRESSION: 2 mm calculus at the left ureterovesical junction causing mild hydronephrosis on the left. Small nonobstructing calculi in each kidney.  Uterus absent. Appendix absent. No bowel obstruction. No abscess. No mesenteric inflammation.   Electronically Signed   By: Lowella Grip III M.D.   On: 07/31/2014 08:47      MDM   Final diagnoses:  Flank pain  Kidney stone on left side    The patient appears uncomfortable, her vital signs are normal except for mild hypotension, she has symptoms in the left flank which could be related to either pyelonephritis or more likely kidney stone given the colicky nature of the pain. Labs ordered, CT abdomen and pelvis without contrast ordered, pain control  with Toradol and Dilaudid, reevaluate.  Improved - feeling much better  CT shows 73mm distal stone - VS norma, no UTI, no hematuria, normal renal function  Pt inforemd of results -s trainer given, meds as below, understanding expressed.  Meds given in ED:  Medications  HYDROmorphone (DILAUDID) injection 0.5 mg (0.5 mg Intravenous Given 07/31/14 0758)  ketorolac (TORADOL) 30 MG/ML injection 30 mg (30 mg Intravenous Given 07/31/14 0757)    New Prescriptions   IBUPROFEN (ADVIL,MOTRIN) 800 MG TABLET    Take 1 tablet (800 mg total) by mouth 3 (three) times daily.   ONDANSETRON (ZOFRAN ODT) 4 MG DISINTEGRATING TABLET    Take 1 tablet (4 mg total) by mouth every 8 (eight) hours as needed for nausea.   OXYCODONE-ACETAMINOPHEN (PERCOCET) 5-325 MG PER TABLET    Take 1 tablet by mouth every 4 (four) hours as needed.   TAMSULOSIN (FLOMAX) 0.4 MG CAPS CAPSULE    Take 1 capsule (0.4 mg total) by mouth 2 (two) times daily.      Noemi Chapel, MD 07/31/14 [phone]

## 2014-07-31 NOTE — Discharge Instructions (Signed)
Please call your doctor for a followup appointment within 24-48 hours. When you talk to your doctor please let them know that you were seen in the emergency department and have them acquire all of your records so that they can discuss the findings with you and formulate a treatment plan to fully care for your new and ongoing problems. ° °

## 2014-07-31 NOTE — ED Notes (Signed)
Pt writhing in bed, yelling with pain-- states left flank pain and left lower abd pain started in today-- suddenly-- unable to find a comfortable position. No history of kidney stones.

## 2015-05-30 ENCOUNTER — Ambulatory Visit: Payer: BLUE CROSS/BLUE SHIELD | Attending: Gynecologic Oncology | Admitting: Gynecologic Oncology

## 2015-05-30 ENCOUNTER — Encounter: Payer: Self-pay | Admitting: Gynecologic Oncology

## 2015-05-30 ENCOUNTER — Other Ambulatory Visit (HOSPITAL_COMMUNITY)
Admission: RE | Admit: 2015-05-30 | Discharge: 2015-05-30 | Disposition: A | Discharge status: Home / Self Care | Payer: BLUE CROSS/BLUE SHIELD | Source: Ambulatory Visit | Attending: Gynecologic Oncology | Admitting: Gynecologic Oncology

## 2015-05-30 VITALS — BP 110/53 | HR 75 | Temp 97.8°F | Resp 18 | Ht 63.75 in | Wt 155.4 lb

## 2015-05-30 DIAGNOSIS — D069 Carcinoma in situ of cervix, unspecified: Secondary | ICD-10-CM | POA: Diagnosis present

## 2015-05-30 DIAGNOSIS — Z01411 Encounter for gynecological examination (general) (routine) with abnormal findings: Secondary | ICD-10-CM | POA: Insufficient documentation

## 2015-05-30 DIAGNOSIS — N132 Hydronephrosis with renal and ureteral calculous obstruction: Secondary | ICD-10-CM | POA: Diagnosis not present

## 2015-05-30 DIAGNOSIS — Z90722 Acquired absence of ovaries, bilateral: Secondary | ICD-10-CM | POA: Diagnosis not present

## 2015-05-30 DIAGNOSIS — Z8541 Personal history of malignant neoplasm of cervix uteri: Secondary | ICD-10-CM | POA: Diagnosis not present

## 2015-05-30 DIAGNOSIS — C530 Malignant neoplasm of endocervix: Secondary | ICD-10-CM

## 2015-05-30 DIAGNOSIS — Z90710 Acquired absence of both cervix and uterus: Secondary | ICD-10-CM | POA: Insufficient documentation

## 2015-05-30 DIAGNOSIS — Z9079 Acquired absence of other genital organ(s): Secondary | ICD-10-CM | POA: Insufficient documentation

## 2015-05-30 NOTE — Progress Notes (Signed)
Follow Up Note: Gyn-Onc  Stephanie Johnson 45 y.o. female  CC:  Chief Complaint  Patient presents with  . Follow-up    HPI:  Stephanie Johnson is a 51 year old, gravida 6 aborta 3, who had a pap smear that was normal 2 years ago but she did have high risk HPV. She went to see her gynecologist for her annual Pap smear in September, with her Pap smear revealing CIN-3 with severe dysplasia. She underwent a LEEP on October 28, which revealed CIN-3 with a positive margin. They discussed proceeding with a cold knife conization for definitive treatment and diagnosis. She underwent a cold knife conization on December 16 that revealed:   Diagnosis, Cervix, cone:  - INVASIVE ENDOCERVICAL TYPE ADENOCARCINOMA ARISING IN A BACKGROUND OF ENDOCERVICAL ADENOCARCINOMA IN SITU, SEE COMMENT.  - HIGH GRADE SQUAMOUS INTRAEPITHELIAL LESION, CIN-III (SEVERE DYSPLASIA/CIS) WITH ENDOCERVICAL GLANDULAR EXTENSION  - IN SITU ADENOCARCINOMA PRESENT AT ENDOCERVICAL MARGIN.  - ADENOCARCINOMA IS LESS THAN 1MM FROM DEEP EXCISIONAL MARGIN.   The specimen was submitted to Dr. Annamaria Boots at [facility] to affirm the original diagnosis.  Per Dr. Annamaria Boots, there is indeed endocervical adenocarcinoma, grade 2 out of 3.  Given the presence of tumor at excision margins, the tumor is at least 7 mm wide and has a depth of at least 4 mm. No lymph vascular invasion is identified and severe squamous dysplasia is present.  On 04/19/13, she underwent a total robotic radical hysterectomy, bilateral salpingectomies, bilateral pelvic lymph node dissection, bilateral oophoropexy.  Her post-operative course was uneventful.  Final pathology revealed: 1. Fallopian tube, right - UNREMARKABLE FALLOPIAN TUBE. NO ENDOMETRIOSIS OR MALIGNANCY. 2. Fallopian tube, left - UNREMARKABLE FALLOPIAN TUBE. NO ENDOMETRIOSIS OR MALIGNANCY. 3. Uterus +/- tubes/ovaries, neoplastic, with cervix - CERVIX: SQUAMOUS METAPLASIA AND ENDOCERVICAL GLANDULAR TUBAL METAPLASIA  ASSOCIATED WITH INFLAMMATION AND FIBROSIS. NO RESIDUAL MALIGNANCY IDENTIFIED. - ENDOMETRIUM AND MYOMETRIUM: BENIGN WITH NO EVIDENCE OF MALIGNANCY. 4. Vagina, biopsy, posterior margin - BENIGN SQUAMOUS MUCOSA. NO EVIDENCE OF MALIGNANCY. 5. Lymph nodes, regional resection, right pelvic - THREE BENIGN LYMPH NODES (0/3). 6. Lymph nodes, regional resection, left pelvic - THREE BENIGN LYMPH NODES (0/3).  Interval History:   I last saw her in October 2015. She's not been seen by [place] to her primary care provider since that time. Of note she is BRCA negative.  She was seen emergency room in May and was diagnosed with a left renal stone. She had a CT scan of the abdomen and pelvis at that time that revealed:  There is no bowel obstruction. No free air or portal venous air.There is no demonstrable ascites, adenopathy, or abscess in theabdomen or pelvis. There is atherosclerotic change in the aorta butno aneurysm. There are no blastic or lytic bone lesions.  IMPRESSION: 2 mm calculus at the left ureterovesical junction causing mild hydronephrosis on the left. Small nonobstructing calculi in each kidney.  Uterus absent. Appendix absent. No bowel obstruction. No abscess. No mesenteric inflammation.  Review of Systems   Constitutional:  Denies fever. Skin: No rash Cardiovascular: No chest pain, shortness of breath Pulmonary: No cough  Gastro Intestinal:  No change in bowel or bladder habits Genitourinary: No frequency, urgency, or dysuria.  Denies vaginal bleeding and discharge. No dyspareunia Neurologic: No weakness, numbness, or change in gait.  Psychology: Feeling well  Current Meds:  Outpatient Encounter Prescriptions as of 05/30/2015  Medication Sig  . ibuprofen (ADVIL,MOTRIN) 800 MG tablet Take 1 tablet (800 mg total) by mouth 3 (three) times  daily.  . [DISCONTINUED] ondansetron (ZOFRAN ODT) 4 MG disintegrating tablet Take 1 tablet (4 mg total) by mouth every 8 (eight) hours as needed for nausea.   . [DISCONTINUED] oxyCODONE-acetaminophen (PERCOCET) 5-325 MG per tablet Take 1 tablet by mouth every 4 (four) hours as needed.  . [DISCONTINUED] tamsulosin (FLOMAX) 0.4 MG CAPS capsule Take 1 capsule (0.4 mg total) by mouth 2 (two) times daily.   No facility-administered encounter medications on file as of 05/30/2015.    Allergy:  Allergies  Allergen Reactions  . Augmentin [Amoxicillin-Pot Clavulanate] Nausea And Vomiting    Can take Keflex  . Sulfa Antibiotics Nausea And Vomiting    Vomiting    Social Hx:   Social History   Social History  . Marital Status: Married    Spouse Name: N/A  . Number of Children: N/A  . Years of Education: N/A   Occupational History  . Not on file.   Social History Main Topics  . Smoking status: Never Smoker   . Smokeless tobacco: Never Used  . Alcohol Use: Yes     Comment: rare social  . Drug Use: No  . Sexual Activity: Yes   Other Topics Concern  . Not on file   Social History Narrative    Past Surgical Hx:  Past Surgical History  Procedure Laterality Date  . Dilation and curettage of uterus    . Cervical conization w/bx N/A 03/08/2013    Procedure: CONIZATION CERVIX WITH BIOPSY ;  Surgeon: Cheri Fowler, MD;  Location: [facility];  Service: Gynecology;  Laterality: N/A;  1hr OR time  . Childbirth      x 3 NVD  . Robotic assisted total hysterectomy with bilateral salpingo oopherectomy N/A 04/19/2013    Procedure: ROBOTIC ASSISTED RADICAL HYSTERECTOMY WITH BILATERAL SALPINGECTOMY AND PELVIC LYMPH NODE DISSECTION;  Surgeon: Imagene Gurney A. Alycia Rossetti, MD;  Location: [facility];  Service: Gynecology;  Laterality: N/A;    Past Medical Hx:  Past Medical History  Diagnosis Date  . Cancer [facility])     dx. cervical cancer s/p 12'14 cone biopsy  . Stomach problems     "stomach sensitive freq"    Family Hx:  Family History  Problem Relation Age of Onset  . Cancer Mother 7    stage IV  . Cancer Father     prostate  . Hypertension Father   . Stroke  Father   . Migraines Brother   . Cancer Brother 40    prostate  . Cancer Maternal Grandmother     breast   . Heart failure Paternal Grandfather     congestive  . Cancer Maternal Uncle     prostate cancer  . Cancer Cousin 40    Maternal colon cancer    Vitals:  Blood pressure 110/53, pulse 75, temperature 97.8 F (36.6 C), temperature source Oral, resp. rate 18, height 5' 3.75" (1.619 m), weight 155 lb 6.4 oz (70.489 kg), last menstrual period 03/24/2013, SpO2 100 %.  Physical Exam:  General: Well developed, well nourished female in no acute distress. Alert and oriented x 3.   Neck: Supple, no lymphadenopathy, no thyromegaly.  Lungs: Clear to auscultation bilaterally.  Cardiovascular: Regular rate and rhythm  Abdomen: Abdomen soft, non-tender and non-obese. Well-healed surgical incision  Extremities: No bilateral cyanosis, edema, or clubbing.   Pelvic: Normal female genitalia. Speculum examination reveals a normal-appearing vaginal cuff. There are no visible lesions. Pap smear submitted without difficulty.  There is no tenderness, fluctuance, nodularity or masses. Rectal confirms  Assessment/Plan:  45 year old s/p total robotic radical hysterectomy, bilateral salpingectomies, bilateral pelvic lymph node dissection, bilateral oophoropexy on 04/19/2013 for a stage IB 1 adenocarcinoma cervix. She had no residual tumor in her cervix and negative lymph nodes.  We'll followup in results for Pap smear from today. Return to see me in 3 months.    Elvera Almario A., MD  05/30/2015, 4:02 PM

## 2015-05-30 NOTE — Patient Instructions (Signed)
We will notify you of the results of your Pap smear from today. Please return to see Dr. Alycia Rossetti on June 28.

## 2015-06-01 LAB — CYTOLOGY - PAP

## 2015-06-04 ENCOUNTER — Telehealth: Payer: Self-pay

## 2015-06-04 NOTE — Telephone Encounter (Signed)
Orders received from [facility] to contact the patient to update with PAP results collected on 05/30/2015 are "normal" with "yeast" present. Attempted to contact the patient , no answer ,left a detailed message with results and instructions to use OTC "Monistat" if she is experiencing symptoms . OBG/ONC contact information given if the patient has additional questions or concerns.

## 2015-06-19 ENCOUNTER — Ambulatory Visit (INDEPENDENT_AMBULATORY_CARE_PROVIDER_SITE_OTHER): Payer: BLUE CROSS/BLUE SHIELD | Admitting: Internal Medicine

## 2015-06-19 ENCOUNTER — Encounter: Payer: Self-pay | Admitting: Internal Medicine

## 2015-06-19 VITALS — BP 102/70 | HR 88 | Temp 97.8°F | Resp 20 | Ht 63.75 in | Wt 151.0 lb

## 2015-06-19 DIAGNOSIS — J069 Acute upper respiratory infection, unspecified: Secondary | ICD-10-CM | POA: Diagnosis not present

## 2015-06-19 DIAGNOSIS — J029 Acute pharyngitis, unspecified: Secondary | ICD-10-CM

## 2015-06-19 MED ORDER — HYDROCODONE-ACETAMINOPHEN 5-325 MG PO TABS: 1.0000 | ORAL_TABLET | Freq: Four times a day (QID) | ORAL | Status: DC | PRN

## 2015-06-19 NOTE — Progress Notes (Signed)
Subjective:    Patient ID: Stephanie Johnson, female    DOB: Dec 23, 1970, 45 y.o.   MRN: [id number]  HPI  45 year old patient who is seen with a chief complaint of cough, chest congestion and intermittent fever over the past few days.  Patient does have a daughter who has been ill and also has a history of type 1 diabetes requiring insulin pump daughter has improved.  Daughter also had a sore throat but was negative for strep. 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 Cancer [facility])     dx. cervical cancer s/p 12'14 cone biopsy  . Stomach problems     "stomach sensitive freq"    Social History   Social History  . Marital Status: Married    Spouse Name: N/A  . Number of Children: N/A  . Years of Education: N/A   Occupational History  . Not on file.   Social History Main Topics  . Smoking status: Never Smoker   . Smokeless tobacco: Never Used  . Alcohol Use: Yes     Comment: rare social  . Drug Use: No  . Sexual Activity: Yes   Other Topics Concern  . Not on file   Social History Narrative    Past Surgical History  Procedure Laterality Date  . Dilation and curettage of uterus    . Cervical conization w/bx N/A 03/08/2013    Procedure: CONIZATION CERVIX WITH BIOPSY ;  Surgeon: Cheri Fowler, MD;  Location: [facility];  Service: Gynecology;  Laterality: N/A;  1hr OR time  . Childbirth      x 3 NVD  . Robotic assisted total hysterectomy with bilateral salpingo oopherectomy N/A 04/19/2013    Procedure: ROBOTIC ASSISTED RADICAL HYSTERECTOMY WITH BILATERAL SALPINGECTOMY AND PELVIC LYMPH NODE DISSECTION;  Surgeon: Imagene Gurney A. Alycia Rossetti, MD;  Location: [facility];  Service: Gynecology;  Laterality: N/A;    Family History  Problem Relation Age of Onset  . Cancer Mother 81    stage IV  . Cancer Father     prostate  . Hypertension Father   . Stroke Father   . Migraines Brother   . Cancer Brother 40    prostate  . Cancer Maternal  Grandmother     breast   . Heart failure Paternal Grandfather     congestive  . Cancer Maternal Uncle     prostate cancer  . Cancer Cousin 40    Maternal colon cancer    Allergies  Allergen Reactions  . Augmentin [Amoxicillin-Pot Clavulanate] Nausea And Vomiting    Can take Keflex  . Sulfa Antibiotics Nausea And Vomiting    Vomiting    Current Outpatient Prescriptions on File Prior to Visit  Medication Sig Dispense Refill  . ibuprofen (ADVIL,MOTRIN) 800 MG tablet Take 1 tablet (800 mg total) by mouth 3 (three) times daily. 21 tablet 0   No current facility-administered medications on file prior to visit.    BP 102/70 mmHg  Pulse 88  Temp(Src) 97.8 F (36.6 C) (Oral)  Resp 20  Ht 5' 3.75" (1.619 m)  Wt 151 lb (68.493 kg)  BMI 26.13 kg/m2  SpO2 98%  LMP 03/24/2013     Review of Systems  Constitutional: Positive for fever, activity change, appetite change and fatigue.  HENT: Positive for sore throat. Negative for congestion, dental problem, hearing loss, rhinorrhea, sinus pressure and tinnitus.   Eyes: Negative for pain, discharge and visual disturbance.  Respiratory: Positive for cough. Negative for shortness of breath.   Cardiovascular: Negative for chest pain, palpitations and leg swelling.  Gastrointestinal: Negative for nausea, vomiting, abdominal pain, diarrhea, constipation, blood in stool and abdominal distention.  Genitourinary: Negative for dysuria, urgency, frequency, hematuria, flank pain, vaginal bleeding, vaginal discharge, difficulty urinating, vaginal pain and pelvic pain.  Musculoskeletal: Negative for joint swelling, arthralgias and gait problem.  Skin: Negative for rash.  Neurological: Negative for dizziness, syncope, speech difficulty, weakness, numbness and headaches.  Hematological: Negative for adenopathy.  Psychiatric/Behavioral: Negative for behavioral problems, dysphoric mood and agitation. The patient is not nervous/anxious.          Objective:   Physical Exam  Constitutional: She is oriented to person, place, and time. She appears well-developed and well-nourished.  HENT:  Head: Normocephalic.  Right Ear: External ear normal.  Left Ear: External ear normal.  Mild erythema of the oropharynx  Eyes: Conjunctivae and EOM are normal. Pupils are equal, round, and reactive to light.  Neck: Normal range of motion. Neck supple. No thyromegaly present.  Cardiovascular: Normal rate, regular rhythm, normal heart sounds and intact distal pulses.   Pulmonary/Chest: Effort normal and breath sounds normal. No respiratory distress. She has no wheezes. She has no rales.  Abdominal: Soft. Bowel sounds are normal. She exhibits no mass. There is no tenderness.  Musculoskeletal: Normal range of motion.  Lymphadenopathy:    She has no cervical adenopathy.  Neurological: She is alert and oriented to person, place, and time.  Skin: Skin is warm and dry. No rash noted.  Psychiatric: She has a normal mood and affect. Her behavior is normal.          Assessment & Plan:   Viral URI with cough and pharyngitis.  Will treat symptomatically

## 2015-06-19 NOTE — Patient Instructions (Signed)
Acute bronchitis symptoms for less than 10 days are generally not helped by antibiotics.  Take over-the-counter expectorants and cough medications such as  Mucinex DM.  Call if there is no improvement in 5 to 7 days or if  you develop worsening cough, fever, or new symptoms, such as shortness of breath or chest pain.  Drink as much fluid as you  can tolerate over the next few days

## 2015-06-19 NOTE — Progress Notes (Signed)
Pre visit review using our clinic review tool, if applicable. No additional management support is needed unless otherwise documented below in the visit note. 

## 2015-08-29 ENCOUNTER — Encounter (INDEPENDENT_AMBULATORY_CARE_PROVIDER_SITE_OTHER): Payer: Self-pay

## 2015-09-19 ENCOUNTER — Other Ambulatory Visit: Payer: Self-pay | Admitting: Gynecologic Oncology

## 2015-09-19 ENCOUNTER — Ambulatory Visit: Payer: BC Managed Care – PPO | Admitting: Gynecologic Oncology

## 2015-09-19 DIAGNOSIS — C531 Malignant neoplasm of exocervix: Secondary | ICD-10-CM

## 2015-09-19 NOTE — Addendum Note (Signed)
Addended by: Joylene John D on: 09/19/2015 11:32 AM   Modules accepted: Orders

## 2015-11-14 ENCOUNTER — Encounter: Payer: Self-pay | Admitting: Gynecologic Oncology

## 2015-11-14 ENCOUNTER — Other Ambulatory Visit (HOSPITAL_COMMUNITY)
Admission: RE | Admit: 2015-11-14 | Discharge: 2015-11-14 | Disposition: A | Discharge status: Home / Self Care | Payer: BLUE CROSS/BLUE SHIELD | Source: Ambulatory Visit | Attending: Gynecologic Oncology | Admitting: Gynecologic Oncology

## 2015-11-14 ENCOUNTER — Other Ambulatory Visit: Payer: Self-pay | Admitting: Gynecologic Oncology

## 2015-11-14 ENCOUNTER — Ambulatory Visit: Payer: BLUE CROSS/BLUE SHIELD | Attending: Gynecologic Oncology | Admitting: Gynecologic Oncology

## 2015-11-14 VITALS — BP 108/66 | HR 81 | Temp 98.2°F | Resp 18 | Ht 63.75 in | Wt 152.6 lb

## 2015-11-14 DIAGNOSIS — Z01411 Encounter for gynecological examination (general) (routine) with abnormal findings: Secondary | ICD-10-CM | POA: Diagnosis not present

## 2015-11-14 DIAGNOSIS — C530 Malignant neoplasm of endocervix: Secondary | ICD-10-CM

## 2015-11-14 DIAGNOSIS — Z90710 Acquired absence of both cervix and uterus: Secondary | ICD-10-CM | POA: Insufficient documentation

## 2015-11-14 DIAGNOSIS — Z9889 Other specified postprocedural states: Secondary | ICD-10-CM | POA: Diagnosis not present

## 2015-11-14 NOTE — Progress Notes (Signed)
Follow Up Note: Gyn-Onc  [name] 45 y.o. female  CC:  Chief Complaint  Patient presents with  . Cervical Cancer    follow up    HPI:  Stephanie Johnson is a 48 year old, gravida 6 aborta 3, who had a pap smear that was normal 2 years ago but she did have high risk HPV. She went to see her gynecologist for her annual Pap smear in September, with her Pap smear revealing CIN-3 with severe dysplasia. She underwent a LEEP on October 28, which revealed CIN-3 with a positive margin. They discussed proceeding with a cold knife conization for definitive treatment and diagnosis. She underwent a cold knife conization on March 08, 2013 that revealed:   Diagnosis, Cervix, cone:  - INVASIVE ENDOCERVICAL TYPE ADENOCARCINOMA ARISING IN A BACKGROUND OF ENDOCERVICAL ADENOCARCINOMA IN SITU, SEE COMMENT.  - HIGH GRADE SQUAMOUS INTRAEPITHELIAL LESION, CIN-III (SEVERE DYSPLASIA/CIS) WITH ENDOCERVICAL GLANDULAR EXTENSION  - IN SITU ADENOCARCINOMA PRESENT AT ENDOCERVICAL MARGIN.  - ADENOCARCINOMA IS LESS THAN 1MM FROM DEEP EXCISIONAL MARGIN.   The specimen was submitted to Dr. Annamaria Boots at [facility] to affirm the original diagnosis.  Per Dr. Annamaria Boots, there is indeed endocervical adenocarcinoma, grade 2 out of 3.  Given the presence of tumor at excision margins, the tumor is at least 7 mm wide and has a depth of at least 4 mm. No lymph vascular invasion is identified and severe squamous dysplasia is present.  On 04/19/13, she underwent a total robotic radical hysterectomy, bilateral salpingectomies, bilateral pelvic lymph node dissection, bilateral oophoropexy.  Her post-operative course was uneventful.  Final pathology revealed: 1. Fallopian tube, right - UNREMARKABLE FALLOPIAN TUBE. NO ENDOMETRIOSIS OR MALIGNANCY. 2. Fallopian tube, left - UNREMARKABLE FALLOPIAN TUBE. NO ENDOMETRIOSIS OR MALIGNANCY. 3. Uterus +/- tubes/ovaries, neoplastic, with cervix - CERVIX: SQUAMOUS METAPLASIA AND ENDOCERVICAL  GLANDULAR TUBAL METAPLASIA ASSOCIATED WITH INFLAMMATION AND FIBROSIS. NO RESIDUAL MALIGNANCY IDENTIFIED. - ENDOMETRIUM AND MYOMETRIUM: BENIGN WITH NO EVIDENCE OF MALIGNANCY. 4. Vagina, biopsy, posterior margin - BENIGN SQUAMOUS MUCOSA. NO EVIDENCE OF MALIGNANCY. 5. Lymph nodes, regional resection, right pelvic - THREE BENIGN LYMPH NODES (0/3). 6. Lymph nodes, regional resection, left pelvic - THREE BENIGN LYMPH NODES (0/3).  She is BRCA negative.   Interval History:   I last saw her in March. Her exam and Pap smear were both unremarkable. She comes in today for follow-up. She's overall doing quite well. She is in a new that her urinary practice does not cause her to have to travel as much which is very good for quality of life and management of the kids. When asked about her mammograms she states that she has never had one. Most at her scheduled for that. She has a family history of breast cancer in her mother. She is otherwise without complaints.  Review of Systems  Constitutional:  Denies fever. Skin: No rash Cardiovascular: No chest pain, shortness of breath Pulmonary: No cough  Gastro Intestinal:  No change in bowel or bladder habits Genitourinary: No frequency, urgency, or dysuria.  Denies vaginal bleeding and discharge. No dyspareunia Neurologic: No weakness, numbness, or change in gait.  Psychology: Feeling well  Current Meds:  Outpatient Encounter Prescriptions as of 11/14/2015  Medication Sig  . ibuprofen (ADVIL,MOTRIN) 800 MG tablet Take 1 tablet (800 mg total) by mouth 3 (three) times daily. (Patient not taking: Reported on 11/14/2015)  . [DISCONTINUED] HYDROcodone-acetaminophen (NORCO/VICODIN) 5-325 MG tablet Take 1 tablet by mouth every 6 (six) hours as needed for moderate pain.  No facility-administered encounter medications on file as of 11/14/2015.     Allergy:  Allergies  Allergen Reactions  . Augmentin [Amoxicillin-Pot Clavulanate] Nausea And Vomiting    Can take Keflex   . Sulfa Antibiotics Nausea And Vomiting    Vomiting    Social Hx:   Social History   Social History  . Marital status: Married    Spouse name: N/A  . Number of children: N/A  . Years of education: N/A   Occupational History  . Not on file.   Social History Main Topics  . Smoking status: Never Smoker  . Smokeless tobacco: Never Used  . Alcohol use Yes     Comment: rare social  . Drug use: No  . Sexual activity: Yes   Other Topics Concern  . Not on file   Social History Narrative  . No narrative on file    Past Surgical Hx:  Past Surgical History:  Procedure Laterality Date  . CERVICAL CONIZATION W/BX N/A 03/08/2013   Procedure: CONIZATION CERVIX WITH BIOPSY ;  Surgeon: Cheri Fowler, MD;  Location: [facility];  Service: Gynecology;  Laterality: N/A;  1hr OR time  . childbirth     x 3 NVD  . DILATION AND CURETTAGE OF UTERUS    . ROBOTIC ASSISTED TOTAL HYSTERECTOMY WITH BILATERAL SALPINGO OOPHERECTOMY N/A 04/19/2013   Procedure: ROBOTIC ASSISTED RADICAL HYSTERECTOMY WITH BILATERAL SALPINGECTOMY AND PELVIC LYMPH NODE DISSECTION;  Surgeon: Imagene Gurney A. Alycia Rossetti, MD;  Location: [facility];  Service: Gynecology;  Laterality: N/A;    Past Medical Hx:  Past Medical History:  Diagnosis Date  . Cancer [facility])    dx. cervical cancer s/p 12'14 cone biopsy  . Stomach problems    "stomach sensitive freq"    Family Hx:  Family History  Problem Relation Age of Onset  . Cancer Mother 14    stage IV  . Cancer Father     prostate  . Hypertension Father   . Stroke Father   . Migraines Brother   . Cancer Brother 40    prostate  . Cancer Maternal Grandmother     breast   . Heart failure Paternal Grandfather     congestive  . Cancer Maternal Uncle     prostate cancer  . Cancer Cousin 40    Maternal colon cancer    Vitals:  Blood pressure 108/66, pulse 81, temperature 98.2 F (36.8 C), temperature source Oral, resp. rate 18, height 5' 3.75" (1.619 m), weight 152 lb 9.6 oz (69.2  kg), last menstrual period 03/24/2013, SpO2 99 %.  Physical Exam:  General: Well developed, well nourished female in no acute distress. Alert and oriented x 3.   Neck: Supple, no lymphadenopathy, no thyromegaly.  Lungs: Clear to auscultation bilaterally.  Cardiovascular: Regular rate and rhythm  Abdomen: Abdomen soft, non-tender and non-obese. Well-healed surgical incision  Extremities: No bilateral cyanosis, edema, or clubbing.   Pelvic: Normal female genitalia. Speculum examination reveals a normal-appearing vaginal cuff. There are no visible lesions. Pap smear submitted without difficulty.  There is no tenderness, fluctuance, nodularity or masses. Rectal confirms  Assessment/Plan:  45 year old s/p total robotic radical hysterectomy, bilateral salpingectomies, bilateral pelvic lymph node dissection, bilateral oophoropexy on 04/19/2013 for a stage IB 1 adenocarcinoma cervix. She had no residual tumor in her cervix and negative lymph nodes.  We'll followup in results for Pap smear from today. Return to see me in 6 months.   We will get her scheduled for her screening mammogram.  Nancy Marus A., MD  11/14/2015, 2:54 PM

## 2015-11-14 NOTE — Patient Instructions (Signed)
We will contact you with the results of your pap smear from today.  We will also arrange for you to have a mammogram scheduled in the near future and will call you with that information.  Plan to follow up in six months or sooner if needed.  Please call [phone] in October or November to schedule your appointment for Feb 2018.  Please call for any questions or concerns or new symptoms.

## 2015-11-15 ENCOUNTER — Other Ambulatory Visit: Payer: Self-pay | Admitting: Physical Therapy

## 2015-11-15 ENCOUNTER — Other Ambulatory Visit: Payer: Self-pay | Admitting: Gynecologic Oncology

## 2015-11-15 DIAGNOSIS — Z1231 Encounter for screening mammogram for malignant neoplasm of breast: Secondary | ICD-10-CM

## 2015-11-21 ENCOUNTER — Telehealth: Payer: Self-pay

## 2015-11-21 LAB — CYTOLOGY - PAP

## 2015-11-21 NOTE — Telephone Encounter (Signed)
Orders received from [place] to contact the patient to update with PAP results being "normal" ,showing some "yeast". If you are having symptoms recommendations are to use OTC Monistat . Patient contacted and updated , patient states understanding , denies symptoms of yeast infection at this time. Patient denies further questions at this time.

## 2015-12-12 ENCOUNTER — Ambulatory Visit
Admission: RE | Admit: 2015-12-12 | Discharge: 2015-12-12 | Disposition: A | Discharge status: Home / Self Care | Payer: BLUE CROSS/BLUE SHIELD | Source: Ambulatory Visit | Attending: Gynecologic Oncology | Admitting: Gynecologic Oncology

## 2015-12-12 DIAGNOSIS — Z1231 Encounter for screening mammogram for malignant neoplasm of breast: Secondary | ICD-10-CM

## 2016-03-26 ENCOUNTER — Other Ambulatory Visit: Payer: Self-pay | Admitting: Gynecologic Oncology

## 2016-03-26 ENCOUNTER — Ambulatory Visit: Payer: BLUE CROSS/BLUE SHIELD | Attending: Gynecologic Oncology | Admitting: Gynecologic Oncology

## 2016-03-26 ENCOUNTER — Encounter: Payer: Self-pay | Admitting: Gynecologic Oncology

## 2016-03-26 VITALS — BP 107/76 | HR 85 | Temp 98.2°F | Ht 63.75 in | Wt 152.8 lb

## 2016-03-26 DIAGNOSIS — Z5189 Encounter for other specified aftercare: Secondary | ICD-10-CM | POA: Diagnosis not present

## 2016-03-26 DIAGNOSIS — C531 Malignant neoplasm of exocervix: Secondary | ICD-10-CM

## 2016-03-26 DIAGNOSIS — Z9889 Other specified postprocedural states: Secondary | ICD-10-CM | POA: Insufficient documentation

## 2016-03-26 DIAGNOSIS — Z880 Allergy status to penicillin: Secondary | ICD-10-CM | POA: Diagnosis not present

## 2016-03-26 DIAGNOSIS — Z8541 Personal history of malignant neoplasm of cervix uteri: Secondary | ICD-10-CM | POA: Diagnosis not present

## 2016-03-26 NOTE — Progress Notes (Signed)
Follow Up Note: Gyn-Onc  Stephanie Johnson 46 y.o. female  CC:  Chief Complaint  Patient presents with  . malignant neoplasm of endocervix    HPI:  Stephanie Johnson is a 74 year old, gravida 6 aborta 3, who had a pap smear that was normal 2 years ago but she did have high risk HPV. She went to see her gynecologist for her annual Pap smear in September, with her Pap smear revealing CIN-3 with severe dysplasia. She underwent a LEEP on October 28, which revealed CIN-3 with a positive margin. They discussed proceeding with a cold knife conization for definitive treatment and diagnosis. She underwent a cold knife conization on March 08, 2013 that revealed:   Diagnosis, Cervix, cone:  - INVASIVE ENDOCERVICAL TYPE ADENOCARCINOMA ARISING IN A BACKGROUND OF ENDOCERVICAL ADENOCARCINOMA IN SITU, SEE COMMENT.  - HIGH GRADE SQUAMOUS INTRAEPITHELIAL LESION, CIN-III (SEVERE DYSPLASIA/CIS) WITH ENDOCERVICAL GLANDULAR EXTENSION  - IN SITU ADENOCARCINOMA PRESENT AT ENDOCERVICAL MARGIN.  - ADENOCARCINOMA IS LESS THAN 1MM FROM DEEP EXCISIONAL MARGIN.   The specimen was submitted to Dr. Annamaria Boots at [facility] to affirm the original diagnosis.  Per Dr. Annamaria Boots, there is indeed endocervical adenocarcinoma, grade 2 out of 3.  Given the presence of tumor at excision margins, the tumor is at least 7 mm wide and has a depth of at least 4 mm. No lymph vascular invasion is identified and severe squamous dysplasia is present.  On 04/19/13, she underwent a total robotic radical hysterectomy, bilateral salpingectomies, bilateral pelvic lymph node dissection, bilateral oophoropexy.  Her post-operative course was uneventful.  Final pathology revealed: 1. Fallopian tube, right - UNREMARKABLE FALLOPIAN TUBE. NO ENDOMETRIOSIS OR MALIGNANCY. 2. Fallopian tube, left - UNREMARKABLE FALLOPIAN TUBE. NO ENDOMETRIOSIS OR MALIGNANCY. 3. Uterus +/- tubes/ovaries, neoplastic, with cervix - CERVIX: SQUAMOUS METAPLASIA AND ENDOCERVICAL  GLANDULAR TUBAL METAPLASIA ASSOCIATED WITH INFLAMMATION AND FIBROSIS. NO RESIDUAL MALIGNANCY IDENTIFIED. - ENDOMETRIUM AND MYOMETRIUM: BENIGN WITH NO EVIDENCE OF MALIGNANCY. 4. Vagina, biopsy, posterior margin - BENIGN SQUAMOUS MUCOSA. NO EVIDENCE OF MALIGNANCY. 5. Lymph nodes, regional resection, right pelvic - THREE BENIGN LYMPH NODES (0/3). 6. Lymph nodes, regional resection, left pelvic - THREE BENIGN LYMPH NODES (0/3).  She is BRCA negative.   Interval History:   I last saw her in August 2017. Her exam and Pap smear were both unremarkable. She comes in today for follow-up. She's overall doing quite well. She did have a mammogram in September that was unremarkable.  Review of Systems  Constitutional:  Denies fever. Skin: No rash Cardiovascular: No chest pain, shortness of breath Pulmonary: No cough  Gastro Intestinal:  No change in bowel or bladder habits Genitourinary: No frequency, urgency, or dysuria.  Denies vaginal bleeding and discharge. No dyspareunia Neurologic: No weakness, numbness, or change in gait.  Psychology: Feeling well  Current Meds:  Outpatient Encounter Prescriptions as of 03/26/2016  Medication Sig  . ibuprofen (ADVIL,MOTRIN) 800 MG tablet Take 1 tablet (800 mg total) by mouth 3 (three) times daily. (Patient not taking: Reported on 03/26/2016)  . loperamide (IMODIUM A-D) 2 MG tablet Take 2 mg by mouth as needed for diarrhea or loose stools.   No facility-administered encounter medications on file as of 03/26/2016.     Allergy:  Allergies  Allergen Reactions  . Augmentin [Amoxicillin-Pot Clavulanate] Nausea And Vomiting    Can take Keflex  . Sulfa Antibiotics Nausea And Vomiting    Vomiting    Social Hx:   Social History   Social History  . Marital  status: Married    Spouse name: N/A  . Number of children: N/A  . Years of education: N/A   Occupational History  . Not on file.   Social History Main Topics  . Smoking status: Never Smoker  . Smokeless  tobacco: Never Used  . Alcohol use Yes     Comment: rare social  . Drug use: No  . Sexual activity: Yes   Other Topics Concern  . Not on file   Social History Narrative  . No narrative on file    Past Surgical Hx:  Past Surgical History:  Procedure Laterality Date  . CERVICAL CONIZATION W/BX N/A 03/08/2013   Procedure: CONIZATION CERVIX WITH BIOPSY ;  Surgeon: Cheri Fowler, MD;  Location: [facility];  Service: Gynecology;  Laterality: N/A;  1hr OR time  . childbirth     x 3 NVD  . DILATION AND CURETTAGE OF UTERUS    . ROBOTIC ASSISTED TOTAL HYSTERECTOMY WITH BILATERAL SALPINGO OOPHERECTOMY N/A 04/19/2013   Procedure: ROBOTIC ASSISTED RADICAL HYSTERECTOMY WITH BILATERAL SALPINGECTOMY AND PELVIC LYMPH NODE DISSECTION;  Surgeon: Imagene Gurney A. Alycia Rossetti, MD;  Location: [facility];  Service: Gynecology;  Laterality: N/A;    Past Medical Hx:  Past Medical History:  Diagnosis Date  . Cancer [facility])    dx. cervical cancer s/p 12'14 cone biopsy  . Stomach problems    "stomach sensitive freq"    Family Hx:  Family History  Problem Relation Age of Onset  . Cancer Mother 68    stage IV  . Cancer Father     prostate  . Hypertension Father   . Stroke Father   . Migraines Brother   . Cancer Brother 40    prostate  . Cancer Maternal Grandmother     breast   . Heart failure Paternal Grandfather     congestive  . Cancer Maternal Uncle     prostate cancer  . Cancer Cousin 40    Maternal colon cancer    Vitals:  Blood pressure 107/76, pulse 85, temperature 98.2 F (36.8 C), temperature source Oral, height 5' 3.75" (1.619 m), weight 152 lb 12.8 oz (69.3 kg), last menstrual period 03/24/2013, SpO2 99 %.  Physical Exam:  General: Well developed, well nourished female in no acute distress. Alert and oriented x 3.   Neck: Supple, no lymphadenopathy, no thyromegaly.  Lungs: Clear to auscultation bilaterally.  Cardiovascular: Regular rate and rhythm  Abdomen: Abdomen soft, non-tender and  non-obese. Well-healed surgical incision. No appreciable hernias.  Groins: No lymphadenopathy.  Extremities: No bilateral cyanosis, edema, or clubbing.   Pelvic: Normal female genitalia. Speculum examination reveals a normal-appearing vaginal cuff. There are no visible lesions. Pap smear submitted without difficulty.  There is no tenderness, fluctuance, nodularity or masses. Rectal confirms  Assessment/Plan:  46 year old s/p total robotic radical hysterectomy, bilateral salpingectomies, bilateral pelvic lymph node dissection, bilateral oophoropexy on 04/19/2013 for a stage IB 1 adenocarcinoma cervix. She had no residual tumor in her cervix and negative lymph nodes.  We'll followup in results for Pap smear from today. Return to see me in 6 months.      Neeka Urista A., MD  03/26/2016, 1:15 PM

## 2016-03-26 NOTE — Patient Instructions (Signed)
We will notify you of the results of your Pap smear from today. Please return to see [place] in GYN oncology in 6 months.

## 2016-04-04 ENCOUNTER — Telehealth: Payer: Self-pay

## 2016-04-04 LAB — CYTOLOGY - PAP: Diagnosis: NEGATIVE

## 2016-04-04 NOTE — Telephone Encounter (Signed)
Pt notified about pap results: negative.  No questions or concerns voiced. Discussed that candida was noted in specimen obtained, pt stated that she has a history of frequent yeast infections. Pt stated that she will purchase OTC medication.

## 2016-09-17 ENCOUNTER — Ambulatory Visit: Payer: BLUE CROSS/BLUE SHIELD | Attending: Gynecologic Oncology | Admitting: Gynecologic Oncology

## 2016-09-17 ENCOUNTER — Encounter: Payer: Self-pay | Admitting: Gynecologic Oncology

## 2016-09-17 ENCOUNTER — Other Ambulatory Visit: Payer: Self-pay | Admitting: Gynecologic Oncology

## 2016-09-17 ENCOUNTER — Other Ambulatory Visit (HOSPITAL_COMMUNITY)
Admission: RE | Admit: 2016-09-17 | Discharge: 2016-09-17 | Disposition: A | Discharge status: Home / Self Care | Payer: BLUE CROSS/BLUE SHIELD | Source: Ambulatory Visit | Attending: Gynecologic Oncology | Admitting: Gynecologic Oncology

## 2016-09-17 VITALS — BP 108/73 | HR 80 | Temp 97.3°F | Resp 18 | Wt 151.0 lb

## 2016-09-17 DIAGNOSIS — Z8541 Personal history of malignant neoplasm of cervix uteri: Secondary | ICD-10-CM

## 2016-09-17 DIAGNOSIS — C530 Malignant neoplasm of endocervix: Secondary | ICD-10-CM | POA: Diagnosis present

## 2016-09-17 DIAGNOSIS — Z01411 Encounter for gynecological examination (general) (routine) with abnormal findings: Secondary | ICD-10-CM | POA: Diagnosis present

## 2016-09-17 DIAGNOSIS — Z90722 Acquired absence of ovaries, bilateral: Secondary | ICD-10-CM

## 2016-09-17 DIAGNOSIS — Z9889 Other specified postprocedural states: Secondary | ICD-10-CM | POA: Insufficient documentation

## 2016-09-17 DIAGNOSIS — Z880 Allergy status to penicillin: Secondary | ICD-10-CM | POA: Insufficient documentation

## 2016-09-17 NOTE — Patient Instructions (Signed)
Plan to follow up in six months or sooner if needed.  Please call for any needs. 

## 2016-09-17 NOTE — Addendum Note (Signed)
Addended by: Joylene John D on: 09/17/2016 04:06 PM   Modules accepted: Orders

## 2016-09-17 NOTE — Progress Notes (Signed)
Follow Up Note: Gyn-Onc  Stephanie Johnson 46 y.o. female  CC:  Chief Complaint  Patient presents with  . Cancer of the Endocervix    HPI:  Stephanie Johnson is a 15 year old, gravida 6 aborta 3, who had a pap smear that was normal 2 years ago but she did have high risk HPV. She went to see her gynecologist for her annual Pap smear in September, with her Pap smear revealing CIN-3 with severe dysplasia. She underwent a LEEP on October 28, which revealed CIN-3 with a positive margin. They discussed proceeding with a cold knife conization for definitive treatment and diagnosis. She underwent a cold knife conization on March 08, 2013 that revealed:   Diagnosis, Cervix, cone:  - INVASIVE ENDOCERVICAL TYPE ADENOCARCINOMA ARISING IN A BACKGROUND OF ENDOCERVICAL ADENOCARCINOMA IN SITU, SEE COMMENT.  - HIGH GRADE SQUAMOUS INTRAEPITHELIAL LESION, CIN-III (SEVERE DYSPLASIA/CIS) WITH ENDOCERVICAL GLANDULAR EXTENSION  - IN SITU ADENOCARCINOMA PRESENT AT ENDOCERVICAL MARGIN.  - ADENOCARCINOMA IS LESS THAN 1MM FROM DEEP EXCISIONAL MARGIN.   The specimen was submitted to Dr. Annamaria Boots at [facility] to affirm the original diagnosis.  Per Dr. Annamaria Boots, there is indeed endocervical adenocarcinoma, grade 2 out of 3.  Given the presence of tumor at excision margins, the tumor is at least 7 mm wide and has a depth of at least 4 mm. No lymph vascular invasion is identified and severe squamous dysplasia is present.  On 04/19/13, she underwent a total robotic radical hysterectomy, bilateral salpingectomies, bilateral pelvic lymph node dissection, bilateral oophoropexy.  Her post-operative course was uneventful.  Final pathology revealed: 1. Fallopian tube, right - UNREMARKABLE FALLOPIAN TUBE. NO ENDOMETRIOSIS OR MALIGNANCY. 2. Fallopian tube, left - UNREMARKABLE FALLOPIAN TUBE. NO ENDOMETRIOSIS OR MALIGNANCY. 3. Uterus +/- tubes/ovaries, neoplastic, with cervix - CERVIX: SQUAMOUS METAPLASIA AND ENDOCERVICAL GLANDULAR  TUBAL METAPLASIA ASSOCIATED WITH INFLAMMATION AND FIBROSIS. NO RESIDUAL MALIGNANCY IDENTIFIED. - ENDOMETRIUM AND MYOMETRIUM: BENIGN WITH NO EVIDENCE OF MALIGNANCY. 4. Vagina, biopsy, posterior margin - BENIGN SQUAMOUS MUCOSA. NO EVIDENCE OF MALIGNANCY. 5. Lymph nodes, regional resection, right pelvic - THREE BENIGN LYMPH NODES (0/3). 6. Lymph nodes, regional resection, left pelvic - THREE BENIGN LYMPH NODES (0/3).  She is BRCA negative.   Interval History:   I last saw her in January 2018. Her exam and Pap smear were both unremarkable. She comes in today for follow-up. She's overall doing quite well. She did have a mammogram in September that was unremarkable. She is overall doing fairly well. Stays busy at work. I continue to have issues with her daughter sugars as ago for very high to very well. She will be due for a mammogram earlier this fall and she'll take responsibility of scheduling that. She otherwise has really no complaints. There are no new medical problems in the family.  Review of Systems  Constitutional:  Denies fever. Skin: No rash Gastro Intestinal:  No change in bowel or bladder habits Genitourinary: No frequency, urgency, or dysuria.  Denies vaginal bleeding and discharge. No dyspareunia Psychology: Feeling well  Current Meds:  Outpatient Encounter Prescriptions as of 09/17/2016  Medication Sig  . ibuprofen (ADVIL,MOTRIN) 800 MG tablet Take 1 tablet (800 mg total) by mouth 3 (three) times daily.  Marland Kitchen loperamide (IMODIUM A-D) 2 MG tablet Take 2 mg by mouth as needed for diarrhea or loose stools.   No facility-administered encounter medications on file as of 09/17/2016.     Allergy:  Allergies  Allergen Reactions  . Augmentin [Amoxicillin-Pot Clavulanate] Nausea And Vomiting  Can take Keflex  . Sulfa Antibiotics Nausea And Vomiting    Vomiting    Social Hx:   Social History   Social History  . Marital status: Married    Spouse name: N/A  . Number of children: N/A   . Years of education: N/A   Occupational History  . Not on file.   Social History Main Topics  . Smoking status: Never Smoker  . Smokeless tobacco: Never Used  . Alcohol use Yes     Comment: rare social  . Drug use: No  . Sexual activity: Yes   Other Topics Concern  . Not on file   Social History Narrative  . No narrative on file    Past Surgical Hx:  Past Surgical History:  Procedure Laterality Date  . CERVICAL CONIZATION W/BX N/A 03/08/2013   Procedure: CONIZATION CERVIX WITH BIOPSY ;  Surgeon: Cheri Fowler, MD;  Location: [facility];  Service: Gynecology;  Laterality: N/A;  1hr OR time  . childbirth     x 3 NVD  . DILATION AND CURETTAGE OF UTERUS    . ROBOTIC ASSISTED TOTAL HYSTERECTOMY WITH BILATERAL SALPINGO OOPHERECTOMY N/A 04/19/2013   Procedure: ROBOTIC ASSISTED RADICAL HYSTERECTOMY WITH BILATERAL SALPINGECTOMY AND PELVIC LYMPH NODE DISSECTION;  Surgeon: Imagene Gurney A. Alycia Rossetti, MD;  Location: [facility];  Service: Gynecology;  Laterality: N/A;    Past Medical Hx:  Past Medical History:  Diagnosis Date  . Cancer [facility])    dx. cervical cancer s/p 12'14 cone biopsy  . Stomach problems    "stomach sensitive freq"    Family Hx:  Family History  Problem Relation Age of Onset  . Cancer Mother 15       stage IV  . Cancer Father        prostate  . Hypertension Father   . Stroke Father   . Migraines Brother   . Cancer Brother 40       prostate  . Cancer Maternal Grandmother        breast   . Heart failure Paternal Grandfather        congestive  . Cancer Maternal Uncle        prostate cancer  . Cancer Cousin 40       Maternal colon cancer    Vitals:  Blood pressure 108/73, pulse 80, temperature 97.3 F (36.3 C), temperature source Oral, resp. rate 18, weight 151 lb (68.5 kg), last menstrual period 03/24/2013, SpO2 97 %.  Physical Exam:  General: Well developed, well nourished female in no acute distress. Alert and oriented x 3.   Neck: Supple, no lymphadenopathy,  no thyromegaly.  Abdomen: Abdomen soft, non-tender and non-obese. Well-healed surgical incisions. No appreciable hernias.  Groins: No lymphadenopathy.  Extremities: No bilateral cyanosis, edema, or clubbing.   Pelvic: Normal female genitalia. Speculum examination reveals a normal-appearing vaginal cuff. There are no visible lesions. Pap smear submitted without difficulty.  There is no tenderness, fluctuance, nodularity or masses. Rectal confirms  Assessment/Plan:  46 year old s/p total robotic radical hysterectomy, bilateral salpingectomies, bilateral pelvic lymph node dissection, bilateral oophoropexy on 04/19/2013 for a stage IB 1 adenocarcinoma cervix. She had no residual tumor in her cervix and negative lymph nodes.  We'll followup in results for Pap smear from today. Return to see me in 6 months.      Thales Knipple A., MD  09/17/2016, 1:05 PM

## 2016-09-19 LAB — CYTOLOGY - PAP: Diagnosis: NEGATIVE

## 2016-09-22 ENCOUNTER — Telehealth: Payer: Self-pay

## 2016-09-22 NOTE — Telephone Encounter (Signed)
Told Stephanie Johnson that her pap smear was normal except for yeast cell. Pt states that is common for her.  She is not symptomatic at this time.  She does use OTC monistat prn for yeast infections.

## 2016-10-30 ENCOUNTER — Encounter: Payer: Self-pay | Admitting: Family Medicine

## 2016-10-30 ENCOUNTER — Ambulatory Visit (INDEPENDENT_AMBULATORY_CARE_PROVIDER_SITE_OTHER): Payer: BLUE CROSS/BLUE SHIELD | Admitting: Family Medicine

## 2016-10-30 VITALS — BP 112/81 | HR 65 | Temp 98.7°F | Ht 63.75 in | Wt 154.0 lb

## 2016-10-30 DIAGNOSIS — S060X0A Concussion without loss of consciousness, initial encounter: Secondary | ICD-10-CM

## 2016-10-30 DIAGNOSIS — S161XXA Strain of muscle, fascia and tendon at neck level, initial encounter: Secondary | ICD-10-CM | POA: Diagnosis not present

## 2016-10-30 LAB — CBC WITH DIFFERENTIAL/PLATELET
BASOS PCT: 1 % (ref 0.0–3.0)
Basophils Absolute: 0 10*3/uL (ref 0.0–0.1)
Eosinophils Absolute: 0.1 10*3/uL (ref 0.0–0.7)
Eosinophils Relative: 1.1 % (ref 0.0–5.0)
HCT: 38.5 % (ref 36.0–46.0)
Hemoglobin: 12.9 g/dL (ref 12.0–15.0)
Lymphocytes Relative: 37.7 % (ref 12.0–46.0)
Lymphs Abs: 1.8 10*3/uL (ref 0.7–4.0)
MCHC: 33.6 g/dL (ref 30.0–36.0)
MCV: 93.4 fl (ref 78.0–100.0)
MONOS PCT: 8.3 % (ref 3.0–12.0)
Monocytes Absolute: 0.4 10*3/uL (ref 0.1–1.0)
NEUTROS ABS: 2.5 10*3/uL (ref 1.4–7.7)
Neutrophils Relative %: 51.9 % (ref 43.0–77.0)
Platelets: 213 10*3/uL (ref 150.0–400.0)
RBC: 4.12 Mil/uL (ref 3.87–5.11)
RDW: 12.6 % (ref 11.5–15.5)
WBC: 4.9 10*3/uL (ref 4.0–10.5)

## 2016-10-30 LAB — BASIC METABOLIC PANEL
BUN: 16 mg/dL (ref 6–23)
CO2: 30 mEq/L (ref 19–32)
CREATININE: 0.75 mg/dL (ref 0.40–1.20)
Calcium: 9.3 mg/dL (ref 8.4–10.5)
Chloride: 103 mEq/L (ref 96–112)
GFR: 88.11 mL/min (ref >60.00)
GLUCOSE: 90 mg/dL (ref 70–99)
Potassium: 4.3 mEq/L (ref 3.5–5.1)
Sodium: 138 mEq/L (ref 135–145)

## 2016-10-30 LAB — TSH: TSH: 1.54 u[IU]/mL (ref 0.35–4.50)
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
## 2016-10-30 NOTE — Patient Instructions (Signed)
WE NOW OFFER   [facility] FAST TRACK!!!  SAME DAY Appointments for ACUTE CARE  Such as: Sprains, Injuries, cuts, abrasions, rashes, muscle pain, joint pain, back pain Colds, flu, sore throats, headache, allergies, cough, fever  Ear pain, sinus and eye infections Abdominal pain, nausea, vomiting, diarrhea, upset stomach Animal/insect bites  3 Easy Ways to Schedule: Walk-In Scheduling Call in scheduling Mychart Sign-up: [email or web address]         

## 2016-12-11 ENCOUNTER — Encounter: Payer: Self-pay | Admitting: Internal Medicine

## 2017-01-02 ENCOUNTER — Ambulatory Visit (HOSPITAL_COMMUNITY)
Admission: EM | Admit: 2017-01-02 | Discharge: 2017-01-02 | Disposition: A | Discharge status: Home / Self Care | Payer: Worker's Compensation | Attending: Family Medicine | Admitting: Family Medicine

## 2017-01-02 ENCOUNTER — Encounter (HOSPITAL_COMMUNITY): Payer: Self-pay | Admitting: Emergency Medicine

## 2017-01-02 DIAGNOSIS — S51852A Open bite of left forearm, initial encounter: Secondary | ICD-10-CM | POA: Diagnosis not present

## 2017-01-02 DIAGNOSIS — W5501XA Bitten by cat, initial encounter: Secondary | ICD-10-CM

## 2017-01-02 DIAGNOSIS — Z23 Encounter for immunization: Secondary | ICD-10-CM | POA: Diagnosis not present

## 2017-01-02 MED ORDER — CEFTRIAXONE SODIUM 1 G IJ SOLR
INTRAMUSCULAR | Status: AC
  Filled 2017-01-02: qty 10

## 2017-01-02 MED ORDER — TETANUS-DIPHTH-ACELL PERTUSSIS 5-2.5-18.5 LF-MCG/0.5 IM SUSP
INTRAMUSCULAR | Status: AC
  Filled 2017-01-02: qty 0.5

## 2017-01-02 MED ORDER — LIDOCAINE HCL (PF) 1 % IJ SOLN
INTRAMUSCULAR | Status: AC
  Filled 2017-01-02: qty 2

## 2017-01-02 MED ORDER — TETANUS-DIPHTH-ACELL PERTUSSIS 5-2.5-18.5 LF-MCG/0.5 IM SUSP
0.5000 mL | Freq: Once | INTRAMUSCULAR | Status: AC
  Administered 2017-01-02: 0.5 mL via INTRAMUSCULAR

## 2017-01-02 MED ORDER — CEFTRIAXONE SODIUM 1 G IJ SOLR
1.0000 g | Freq: Once | INTRAMUSCULAR | Status: AC
  Administered 2017-01-02: 1 g via INTRAMUSCULAR

## 2017-01-02 MED ORDER — MELOXICAM 15 MG PO TABS: 15.0000 mg | ORAL_TABLET | Freq: Every day | ORAL | 0 refills | Status: DC

## 2017-01-02 MED ORDER — MOXIFLOXACIN HCL 400 MG PO TABS: 400.0000 mg | ORAL_TABLET | Freq: Every day | ORAL | 0 refills | Status: AC

## 2017-01-02 NOTE — ED Provider Notes (Signed)
[facility]    CSN: [id number] Arrival date & time: 01/02/17  1945     History   Chief Complaint Chief Complaint  Patient presents with  . Animal Bite    HPI Quanisha Drewry is a 46 y.o. female.   46 year old female with history of cervical cancer with hysterectomy comes in for few hour history of cat bite on left forearm. 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 States cat was updated on immunizations today, but prior did have rabies vaccine and is a house cat. She scrubbed area with chlorhexidine. States last tetanus was in 2001. No problems moving fingers.       Past Medical History:  Diagnosis Date  . Cancer [facility])    dx. cervical cancer s/p 12'14 cone biopsy  . Stomach problems    "stomach sensitive freq"    Patient Active Problem List   Diagnosis Date Noted  . BRCA negative 06/02/2013  . Cervical cancer ([place]) 04/19/2013  . Carcinoma in situ of cervix 03/08/2013  . ACUTE PHARYNGITIS 04/17/2010  . Acute upper respiratory infection 08/02/2009  . RHUS DERMATITIS 08/02/2009    Past Surgical History:  Procedure Laterality Date  . CERVICAL CONIZATION W/BX N/A 03/08/2013   Procedure: CONIZATION CERVIX WITH BIOPSY ;  Surgeon: Cheri Fowler, MD;  Location: [facility];  Service: Gynecology;  Laterality: N/A;  1hr OR time  . childbirth     x 3 NVD  . DILATION AND CURETTAGE OF UTERUS    . ROBOTIC ASSISTED TOTAL HYSTERECTOMY WITH BILATERAL SALPINGO OOPHERECTOMY N/A 04/19/2013   Procedure: ROBOTIC ASSISTED RADICAL HYSTERECTOMY WITH BILATERAL SALPINGECTOMY AND PELVIC LYMPH NODE DISSECTION;  Surgeon: Imagene Gurney A. Alycia Rossetti, MD;  Location: [facility];  Service: Gynecology;  Laterality: N/A;    OB History    No data available       Home Medications    Prior to Admission medications   Medication Sig Start Date End Date Taking? Authorizing Provider  ibuprofen (ADVIL,MOTRIN) 800 MG tablet Take 1 tablet (800 mg total) by mouth 3 (three) times daily. Patient not taking: Reported  on 10/30/2016 07/31/14   Noemi Chapel, MD  loperamide (IMODIUM A-D) 2 MG tablet Take 2 mg by mouth as needed for diarrhea or loose stools.    [provider]  meloxicam (MOBIC) 15 MG tablet Take 1 tablet (15 mg total) by mouth daily. 01/02/17   Ok Edwards, PA-C  moxifloxacin (AVELOX) 400 MG tablet Take 1 tablet (400 mg total) by mouth daily at 8 pm. 01/02/17 01/07/17  Ok Edwards, PA-C    Family History Family History  Problem Relation Age of Onset  . Cancer Mother 100       stage IV  . Cancer Father        prostate  . Hypertension Father   . Stroke Father   . Migraines Brother   . Cancer Brother 40       prostate  . Cancer Maternal Grandmother        breast   . Heart failure Paternal Grandfather        congestive  . Cancer Maternal Uncle        prostate cancer  . Cancer Cousin 40       Maternal colon cancer    Social History Social History  Substance Use Topics  . Smoking status: Never Smoker  . Smokeless tobacco: Never Used  . Alcohol use Yes     Comment: rare social  Allergies   Augmentin [amoxicillin-pot clavulanate] and Sulfa antibiotics   Review of Systems Review of Systems  Reason unable to perform ROS: See HPI as above.     Physical Exam Triage Vital Signs ED Triage Vitals  Enc Vitals Group     BP 01/02/17 1955 136/72     Pulse Rate 01/02/17 1955 99     Resp 01/02/17 1955 18     Temp 01/02/17 1955 97.8 F (36.6 C)     Temp Source 01/02/17 1955 Oral     SpO2 01/02/17 1955 98 %     Weight --      Height --      Head Circumference --      Peak Flow --      Pain Score 01/02/17 1956 6     Pain Loc --      Pain Edu? --      Excl. in [place]? --    No data found.   Updated Vital Signs BP 136/72 (BP Location: Left Arm)   Pulse 99   Temp 97.8 F (36.6 C) (Oral)   Resp 18   LMP 03/24/2013   SpO2 98%    Physical Exam  Constitutional: She is oriented to person, place, and time. She appears well-developed and well-nourished. No distress.    HENT:  Head: Normocephalic and atraumatic.  Eyes: Pupils are equal, round, and reactive to light. Conjunctivae are normal.  Musculoskeletal:  3 puncture wound on extensor surface and 2 puncture wound on flexor surface on left forearm. Tenderness on palpation on wound. Full ROM of wrist/fingers. Sensation intact.   Neurological: She is alert and oriented to person, place, and time.     UC Treatments / Results  Labs (all labs ordered are listed, but only abnormal results are displayed) Labs Reviewed - No data to display  EKG  EKG Interpretation None       Radiology No results found.  Procedures Procedures (including critical care time)  Medications Ordered in UC Medications  cefTRIAXone (ROCEPHIN) injection 1 g (1 g Intramuscular Given 01/02/17 2023)  Tdap (BOOSTRIX) injection 0.5 mL (0.5 mLs Intramuscular Given 01/02/17 2023)     Initial Impression / Assessment and Plan / UC Course  I have reviewed the triage vital signs and the nursing notes.  Pertinent labs & imaging results that were available during my care of the patient were reviewed by me and considered in my medical decision making (see chart for details).    Given allergies to augmentin, will start moxifloxacin. Rocephin and tetanus injection in office. Mobic for pain. Strict return precautions given.  Case discussed with Dr Valere Dross, who agrees to plan.   Final Clinical Impressions(s) / UC Diagnoses   Final diagnoses:  Cat bite, initial encounter    New Prescriptions New Prescriptions   MELOXICAM (MOBIC) 15 MG TABLET    Take 1 tablet (15 mg total) by mouth daily.   MOXIFLOXACIN (AVELOX) 400 MG TABLET    Take 1 tablet (400 mg total) by mouth daily at 8 pm.      Ok Edwards, PA-C 01/02/17 2029

## 2017-01-02 NOTE — Discharge Instructions (Signed)
Start moxifloxacin as directed. Mobic for pain. Tetanus and rocephin injection today. If unable to tolerate moxifloxacin, can take doxycycline 100mg  twice a day AND clindamycin 450mg  three times a day for 5 days. Monitor for any worsening of symptoms, spreading erythema, increased warmth, trouble moving fingers/arm, go to the emergency department for further evaluation.

## 2017-01-02 NOTE — ED Triage Notes (Signed)
Pt here for cat bite to left wrist today

## 2017-03-03 NOTE — Progress Notes (Signed)
Follow Up Note: Gyn-Onc  Stephanie Johnson 46 y.o. female  CC:  Chief Complaint  Patient presents with  . Malignant neoplasm of cervix, unspecified site [facility])    HPI:  Stephanie Johnson is a 17 year old, gravida 6 aborta 3, who had a pap smear that was normal 2 years ago but she did have high risk HPV. She went to see her gynecologist for her annual Pap smear in September, with her Pap smear revealing CIN-3 with severe dysplasia. She underwent a LEEP on October 28, which revealed CIN-3 with a positive margin. They discussed proceeding with a cold knife conization for definitive treatment and diagnosis. She underwent a cold knife conization on March 08, 2013 that revealed:   Diagnosis, Cervix, cone:  - INVASIVE ENDOCERVICAL TYPE ADENOCARCINOMA ARISING IN A BACKGROUND OF ENDOCERVICAL ADENOCARCINOMA IN SITU, SEE COMMENT.  - HIGH GRADE SQUAMOUS INTRAEPITHELIAL LESION, CIN-III (SEVERE DYSPLASIA/CIS) WITH ENDOCERVICAL GLANDULAR EXTENSION  - IN SITU ADENOCARCINOMA PRESENT AT ENDOCERVICAL MARGIN.  - ADENOCARCINOMA IS LESS THAN 1MM FROM DEEP EXCISIONAL MARGIN.   The specimen was submitted to Dr. Annamaria Boots at [facility] to affirm the original diagnosis.  Per Dr. Annamaria Boots, there is indeed endocervical adenocarcinoma, grade 2 out of 3.  Given the presence of tumor at excision margins, the tumor is at least 7 mm wide and has a depth of at least 4 mm. No lymph vascular invasion is identified and severe squamous dysplasia is present.  On 04/19/13, she underwent a total robotic radical hysterectomy, bilateral salpingectomies, bilateral pelvic lymph node dissection, bilateral oophoropexy.  Her post-operative course was uneventful.  Final pathology revealed: 1. Fallopian tube, right - UNREMARKABLE FALLOPIAN TUBE. NO ENDOMETRIOSIS OR MALIGNANCY. 2. Fallopian tube, left - UNREMARKABLE FALLOPIAN TUBE. NO ENDOMETRIOSIS OR MALIGNANCY. 3. Uterus +/- tubes/ovaries, neoplastic, with cervix - CERVIX: SQUAMOUS METAPLASIA  AND ENDOCERVICAL GLANDULAR TUBAL METAPLASIA ASSOCIATED WITH INFLAMMATION AND FIBROSIS. NO RESIDUAL MALIGNANCY IDENTIFIED. - ENDOMETRIUM AND MYOMETRIUM: BENIGN WITH NO EVIDENCE OF MALIGNANCY. 4. Vagina, biopsy, posterior margin - BENIGN SQUAMOUS MUCOSA. NO EVIDENCE OF MALIGNANCY. 5. Lymph nodes, regional resection, right pelvic - THREE BENIGN LYMPH NODES (0/3). 6. Lymph nodes, regional resection, left pelvic - THREE BENIGN LYMPH NODES (0/3).  She is BRCA negative.   Interval History:   I last saw her in June 2018. Her exam and Pap smear were both unremarkable. She comes in today for follow-up.   She's overall doing quite well. She never had her mammogram in September she represented with her kids and activities. Her daughter continues to have issues with her sugars and they can be as high as 300. She is otherwise physically doing quite well and really denies any significant complaints.  Review of Systems  Constitutional:  Denies fever. Skin: No rash Gastro Intestinal:  No change in bowel or bladder habits Genitourinary: No frequency, urgency, or dysuria.  Denies vaginal bleeding and discharge. No dyspareunia Psychology: Feeling well  Current Meds:  Outpatient Encounter Medications as of 03/04/2017  Medication Sig  . ibuprofen (ADVIL,MOTRIN) 800 MG tablet Take 1 tablet (800 mg total) by mouth 3 (three) times daily. (Patient not taking: Reported on 10/30/2016)  . loperamide (IMODIUM A-D) 2 MG tablet Take 2 mg by mouth as needed for diarrhea or loose stools.  . meloxicam (MOBIC) 15 MG tablet Take 1 tablet (15 mg total) by mouth daily.   No facility-administered encounter medications on file as of 03/04/2017.     Allergy:  Allergies  Allergen Reactions  . Augmentin [Amoxicillin-Pot Clavulanate] Nausea  And Vomiting    Can take Keflex  . Sulfa Antibiotics Nausea And Vomiting    Vomiting    Social Hx:   Social History   Socioeconomic History  . Marital status: Married    Spouse name:  Not on file  . Number of children: Not on file  . Years of education: Not on file  . Highest education level: Not on file  Social Needs  . Financial resource strain: Not on file  . Food insecurity - worry: Not on file  . Food insecurity - inability: Not on file  . Transportation needs - medical: Not on file  . Transportation needs - non-medical: Not on file  Occupational History  . Not on file  Tobacco Use  . Smoking status: Never Smoker  . Smokeless tobacco: Never Used  Substance and Sexual Activity  . Alcohol use: Yes    Comment: rare social  . Drug use: No  . Sexual activity: Yes  Other Topics Concern  . Not on file  Social History Narrative  . Not on file    Past Surgical Hx:  Past Surgical History:  Procedure Laterality Date  . CERVICAL CONIZATION W/BX N/A 03/08/2013   Procedure: CONIZATION CERVIX WITH BIOPSY ;  Surgeon: Cheri Fowler, MD;  Location: [facility];  Service: Gynecology;  Laterality: N/A;  1hr OR time  . childbirth     x 3 NVD  . DILATION AND CURETTAGE OF UTERUS    . ROBOTIC ASSISTED TOTAL HYSTERECTOMY WITH BILATERAL SALPINGO OOPHERECTOMY N/A 04/19/2013   Procedure: ROBOTIC ASSISTED RADICAL HYSTERECTOMY WITH BILATERAL SALPINGECTOMY AND PELVIC LYMPH NODE DISSECTION;  Surgeon: Imagene Gurney A. Alycia Rossetti, MD;  Location: [facility];  Service: Gynecology;  Laterality: N/A;    Past Medical Hx:  Past Medical History:  Diagnosis Date  . Cancer [facility])    dx. cervical cancer s/p 12'14 cone biopsy  . Stomach problems    "stomach sensitive freq"    Family Hx:  Family History  Problem Relation Age of Onset  . Cancer Mother 17       stage IV  . Cancer Father        prostate  . Hypertension Father   . Stroke Father   . Migraines Brother   . Cancer Brother 40       prostate  . Cancer Maternal Grandmother        breast   . Heart failure Paternal Grandfather        congestive  . Cancer Maternal Uncle        prostate cancer  . Cancer Cousin 40       Maternal colon cancer     Vitals:  Blood pressure 115/79, pulse 73, temperature 98.2 F (36.8 C), temperature source Oral, resp. rate 18, height '5\' 4"'  (1.626 m), weight 155 lb 12.8 oz (70.7 kg), last menstrual period 03/24/2013, SpO2 100 %.  Physical Exam:  General: Well developed, well nourished female in no acute distress. Alert and oriented x 3.   Lungs: Clear to auscultation bilaterally.  Cardiac: Regular rate and rhythm.  Neck: Supple, no lymphadenopathy, no thyromegaly.  Abdomen: Abdomen soft, non-tender and non-obese. Well-healed surgical incisions. No appreciable hernias.  Groins: No lymphadenopathy.  Extremities: No bilateral cyanosis, edema, or clubbing.   Pelvic: Normal female genitalia. Speculum examination reveals a normal-appearing vaginal cuff. There are no visible lesions. Pap smear submitted without difficulty.  There is no tenderness, fluctuance, nodularity or masses. Rectal confirms  Assessment/Plan:  46 year old s/p total  robotic radical hysterectomy, bilateral salpingectomies, bilateral pelvic lymph node dissection, bilateral oophoropexy on 04/19/2013 for a stage IB 1 adenocarcinoma cervix. She had no residual tumor in her cervix and negative lymph nodes.  We'll followup in results for Pap smear from today. Return to see me in 6 months.   She will take the responsibility of getting set up for her mammogram.     Kaedin Hicklin A., MD  03/04/2017, 1:24 PM

## 2017-03-04 ENCOUNTER — Encounter: Payer: Self-pay | Admitting: Gynecologic Oncology

## 2017-03-04 ENCOUNTER — Ambulatory Visit: Payer: BLUE CROSS/BLUE SHIELD | Attending: Gynecologic Oncology | Admitting: Gynecologic Oncology

## 2017-03-04 VITALS — BP 115/79 | HR 73 | Temp 98.2°F | Resp 18 | Ht 64.0 in | Wt 155.8 lb

## 2017-03-04 DIAGNOSIS — C530 Malignant neoplasm of endocervix: Secondary | ICD-10-CM | POA: Insufficient documentation

## 2017-03-04 DIAGNOSIS — Z8541 Personal history of malignant neoplasm of cervix uteri: Secondary | ICD-10-CM

## 2017-03-04 DIAGNOSIS — C539 Malignant neoplasm of cervix uteri, unspecified: Secondary | ICD-10-CM

## 2017-03-04 DIAGNOSIS — Z90722 Acquired absence of ovaries, bilateral: Secondary | ICD-10-CM | POA: Insufficient documentation

## 2017-03-04 DIAGNOSIS — Z9079 Acquired absence of other genital organ(s): Secondary | ICD-10-CM | POA: Insufficient documentation

## 2017-03-04 DIAGNOSIS — Z90710 Acquired absence of both cervix and uterus: Secondary | ICD-10-CM | POA: Diagnosis not present

## 2017-03-04 NOTE — Patient Instructions (Signed)
We will notify you of the results of your Pap smear from today. Please return to see [place] in 6 months.

## 2017-03-09 LAB — CYTOLOGY - PAP: Diagnosis: NEGATIVE

## 2018-03-04 ENCOUNTER — Other Ambulatory Visit: Payer: Self-pay | Admitting: Gynecologic Oncology

## 2018-03-04 DIAGNOSIS — Z1231 Encounter for screening mammogram for malignant neoplasm of breast: Secondary | ICD-10-CM

## 2018-03-05 ENCOUNTER — Ambulatory Visit
Admission: RE | Admit: 2018-03-05 | Discharge: 2018-03-05 | Disposition: A | Discharge status: Home / Self Care | Payer: BLUE CROSS/BLUE SHIELD | Source: Ambulatory Visit | Attending: Gynecologic Oncology | Admitting: Gynecologic Oncology

## 2018-03-05 DIAGNOSIS — Z1231 Encounter for screening mammogram for malignant neoplasm of breast: Secondary | ICD-10-CM

## 2018-04-07 ENCOUNTER — Encounter: Payer: Self-pay | Admitting: Adult Health

## 2018-04-07 ENCOUNTER — Ambulatory Visit (INDEPENDENT_AMBULATORY_CARE_PROVIDER_SITE_OTHER): Payer: BLUE CROSS/BLUE SHIELD | Admitting: Adult Health

## 2018-04-07 VITALS — BP 110/60 | Temp 99.3°F | Wt 163.0 lb

## 2018-04-07 DIAGNOSIS — R6889 Other general symptoms and signs: Secondary | ICD-10-CM | POA: Diagnosis not present

## 2018-04-07 LAB — POC INFLUENZA A&B (BINAX/QUICKVUE)
Influenza A, POC: POSITIVE — AB
Influenza B, POC: NEGATIVE

## 2018-04-07 MED ORDER — OSELTAMIVIR PHOSPHATE 75 MG PO CAPS: 75.0000 mg | ORAL_CAPSULE | Freq: Two times a day (BID) | ORAL | 0 refills | Status: AC

## 2018-04-07 NOTE — Progress Notes (Signed)
Subjective:    Patient ID: Stephanie Johnson, female    DOB: 1970-12-03, 48 y.o.   MRN: [id number]  HPI  48 year old female who  has a past medical history of Cancer ([place]) and Stomach problems.   She presents to the office today for an acute issue of flu like symptoms. Her symptoms include that of fever, chills, generalized fatigue, and generalized body aches. Her symptoms started 24 hours ago. 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 Cancer [facility])    dx. cervical cancer s/p 12'14 cone biopsy  . Stomach problems    "stomach sensitive freq"    Social History   Socioeconomic History  . Marital status: Married    Spouse name: Not on file  . Number of children: Not on file  . Years of education: Not on file  . Highest education level: Not on file  Occupational History  . Not on file  Social Needs  . Financial resource strain: Not on file  . Food insecurity:    Worry: Not on file    Inability: Not on file  . Transportation needs:    Medical: Not on file    Non-medical: Not on file  Tobacco Use  . Smoking status: Never Smoker  . Smokeless tobacco: Never Used  Substance and Sexual Activity  . Alcohol use: Yes    Comment: rare social  . Drug use: No  . Sexual activity: Yes  Lifestyle  . Physical activity:    Days per week: Not on file    Minutes per session: Not on file  . Stress: Not on file  Relationships  . Social connections:    Talks on phone: Not on file    Gets together: Not on file    Attends religious service: Not on file    Active member of club or organization: Not on file    Attends meetings of clubs or organizations: Not on file    Relationship status: Not on file  . Intimate partner violence:    Fear of current or ex partner: Not on file    Emotionally abused: Not on file      Physically abused: Not on file    Forced sexual activity: Not on file  Other Topics Concern  . Not on file  Social History Narrative  . Not on file    Past Surgical History:  Procedure Laterality Date  . CERVICAL CONIZATION W/BX N/A 03/08/2013   Procedure: CONIZATION CERVIX WITH BIOPSY ;  Surgeon: Cheri Fowler, MD;  Location: [place] [facility];  Service: Gynecology;  Laterality: N/A;  1hr OR time  . childbirth     x 3 NVD  . DILATION AND CURETTAGE OF UTERUS    . ROBOTIC ASSISTED TOTAL HYSTERECTOMY WITH BILATERAL SALPINGO OOPHERECTOMY N/A 04/19/2013   Procedure: ROBOTIC ASSISTED RADICAL HYSTERECTOMY WITH BILATERAL SALPINGECTOMY AND PELVIC LYMPH NODE DISSECTION;  Surgeon: Imagene Gurney A. Alycia Rossetti, MD;  Location: [facility];  Service: Gynecology;  Laterality: N/A;    Family History  Problem Relation Age of Onset  . Cancer Mother 41       stage IV  . Cancer Father        prostate  . Hypertension Father   . Stroke Father   .  Migraines Brother   . Cancer Brother 40       prostate  . Cancer Maternal Grandmother        breast   . Heart failure Paternal Grandfather        congestive  . Cancer Maternal Uncle        prostate cancer  . Cancer Cousin 40       Maternal colon cancer    Allergies  Allergen Reactions  . Augmentin [Amoxicillin-Pot Clavulanate] Nausea And Vomiting    Can take Keflex  . Sulfa Antibiotics Nausea And Vomiting    Vomiting    Current Outpatient Medications on File Prior to Visit  Medication Sig Dispense Refill  . ibuprofen (ADVIL,MOTRIN) 800 MG tablet Take 1 tablet (800 mg total) by mouth 3 (three) times daily. 21 tablet 0  . loperamide (IMODIUM A-D) 2 MG tablet Take 2 mg by mouth as needed for diarrhea or loose stools.    . meloxicam (MOBIC) 15 MG tablet Take 1 tablet (15 mg total) by mouth daily. 15 tablet 0   No current facility-administered medications on file prior to visit.     LMP 03/24/2013       Objective:   Physical Exam Vitals signs and nursing note  reviewed.  Constitutional:      Appearance: Normal appearance. She is diaphoretic.  HENT:     Right Ear: Tympanic membrane, ear canal and external ear normal. There is no impacted cerumen.     Left Ear: Tympanic membrane, ear canal and external ear normal.     Nose: Nose normal.     Mouth/Throat:     Mouth: Mucous membranes are moist.     Pharynx: Oropharynx is clear.  Cardiovascular:     Rate and Rhythm: Normal rate and regular rhythm.     Pulses: Normal pulses.     Heart sounds: Normal heart sounds.  Pulmonary:     Effort: Pulmonary effort is normal.     Breath sounds: Normal breath sounds.  Lymphadenopathy:     Head:     Right side of head: Submandibular adenopathy present.     Left side of head: Submandibular adenopathy present.  Neurological:     Mental Status: She is alert.  Psychiatric:        Mood and Affect: Mood normal.        Behavior: Behavior normal.        Thought Content: Thought content normal.        Judgment: Judgment normal.       Assessment & Plan:  1. Flu-like symptoms - POC Influenza A&B(BINAX/QUICKVUE)- positive Flu A - oseltamivir (TAMIFLU) 75 MG capsule; Take 1 capsule (75 mg total) by mouth 2 (two) times daily for 7 days.  Dispense: 14 capsule; Refill: 0

## 2018-04-08 ENCOUNTER — Encounter: Payer: Self-pay | Admitting: Adult Health

## 2018-08-13 ENCOUNTER — Telehealth: Payer: Self-pay

## 2018-08-13 NOTE — Telephone Encounter (Signed)
LM for [name] that Stephanie Johnson can see her for an exam on 6-16 or 09-09-18 at 10 am as she requested specifically June 15, 16, or 18 as she was taking these day office to schedule doctor's appointments. She can call back to the office to schedule.

## 2018-08-18 ENCOUNTER — Telehealth: Payer: Self-pay | Admitting: *Deleted

## 2018-08-18 NOTE — Telephone Encounter (Signed)
Called and scheduled the patient for an appt on 6/16 at 1:30pm

## 2018-09-07 ENCOUNTER — Other Ambulatory Visit: Payer: Self-pay

## 2018-09-07 ENCOUNTER — Encounter: Payer: Self-pay | Admitting: Gynecologic Oncology

## 2018-09-07 ENCOUNTER — Inpatient Hospital Stay (HOSPITAL_BASED_OUTPATIENT_CLINIC_OR_DEPARTMENT_OTHER): Payer: BC Managed Care – PPO | Admitting: Gynecologic Oncology

## 2018-09-07 ENCOUNTER — Other Ambulatory Visit (HOSPITAL_COMMUNITY)
Admission: RE | Admit: 2018-09-07 | Discharge: 2018-09-07 | Disposition: A | Discharge status: Home / Self Care | Payer: BC Managed Care – PPO | Source: Ambulatory Visit | Attending: Gynecologic Oncology | Admitting: Gynecologic Oncology

## 2018-09-07 VITALS — BP 98/58 | HR 73 | Temp 98.2°F | Resp 16 | Ht 63.75 in | Wt 155.2 lb

## 2018-09-07 DIAGNOSIS — C539 Malignant neoplasm of cervix uteri, unspecified: Secondary | ICD-10-CM

## 2018-09-07 DIAGNOSIS — Z90710 Acquired absence of both cervix and uterus: Secondary | ICD-10-CM | POA: Insufficient documentation

## 2018-09-07 DIAGNOSIS — Z08 Encounter for follow-up examination after completed treatment for malignant neoplasm: Secondary | ICD-10-CM

## 2018-09-07 DIAGNOSIS — Z8541 Personal history of malignant neoplasm of cervix uteri: Secondary | ICD-10-CM | POA: Insufficient documentation

## 2018-09-07 DIAGNOSIS — Z90722 Acquired absence of ovaries, bilateral: Secondary | ICD-10-CM

## 2018-09-07 NOTE — Patient Instructions (Signed)
We will contact you with the results of your pap smear from today. Plan to follow up in one year or sooner if needed. Please call closer to the date to schedule at [phone].  We will also find out if you need to meet with the genetics counselor for additional testing and will reach out to the [facility] to make sure you are receiving the correct imaging for your breast surveillance.    Please call for any questions or concerns.

## 2018-09-08 ENCOUNTER — Telehealth: Payer: Self-pay | Admitting: Oncology

## 2018-09-08 NOTE — Telephone Encounter (Signed)
Called Dr. Meisinger's office to obtain genetic testing records.  They are checking and will call back.

## 2018-09-08 NOTE — Progress Notes (Signed)
Follow Up Note: Gyn-Onc  Stephanie Johnson 48 y.o. female  CC:  Chief Complaint  Patient presents with  . Malignant neoplasm of cervix, unspecified site [facility])    Follow up    HPI: Stephanie Johnson is a 48 year old female,gravida 6 aborta 3, initially seen in consultation at the request of Dr. Willis Modena who had a pap smear that was normal in 2012 but she did have high risk HPV. Per Dr. Alycia Rossetti: She went to see her gynecologist for her annual Pap smear in September 2014, with her Pap smear revealing CIN-3 with severe dysplasia. She underwent a LEEP on January 18, 2013, which revealed CIN-3 with a positive margin. They discussed proceeding with a cold knife conization for definitive treatment and diagnosis. She underwent a cold knife conization on March 08, 2013 that revealed:   Diagnosis, Cervix, cone:  - INVASIVE ENDOCERVICAL TYPE ADENOCARCINOMA ARISING IN A BACKGROUND OF ENDOCERVICAL ADENOCARCINOMA IN SITU, SEE COMMENT.  - HIGH GRADE SQUAMOUS INTRAEPITHELIAL LESION, CIN-III (SEVERE DYSPLASIA/CIS) WITH ENDOCERVICAL GLANDULAR EXTENSION  - IN SITU ADENOCARCINOMA PRESENT AT ENDOCERVICAL MARGIN.  - ADENOCARCINOMA IS LESS THAN 1MM FROM DEEP EXCISIONAL MARGIN.   The specimen was submitted to Dr. Annamaria Boots at [facility] to affirm the original diagnosis.  Per Dr. Annamaria Boots, there is indeed endocervical adenocarcinoma, grade 2 out of 3.  Given the presence of tumor at excision margins, the tumor is at least 7 mm wide and has a depth of at least 4 mm. No lymph vascular invasion is identified and severe squamous dysplasia is present.  On 04/19/13, she underwent a total robotic radical hysterectomy, bilateral salpingectomies, bilateral pelvic lymph node dissection, bilateral oophoropexy.  Her post-operative course was uneventful.  Final pathology revealed: 1. Fallopian tube, right - UNREMARKABLE FALLOPIAN TUBE. NO ENDOMETRIOSIS OR MALIGNANCY. 2. Fallopian tube, left - UNREMARKABLE FALLOPIAN TUBE. NO  ENDOMETRIOSIS OR MALIGNANCY. 3. Uterus +/- tubes/ovaries, neoplastic, with cervix - CERVIX: SQUAMOUS METAPLASIA AND ENDOCERVICAL GLANDULAR TUBAL METAPLASIA ASSOCIATED WITH INFLAMMATION AND FIBROSIS. NO RESIDUAL MALIGNANCY IDENTIFIED. - ENDOMETRIUM AND MYOMETRIUM: BENIGN WITH NO EVIDENCE OF MALIGNANCY. 4. Vagina, biopsy, posterior margin - BENIGN SQUAMOUS MUCOSA. NO EVIDENCE OF MALIGNANCY. 5. Lymph nodes, regional resection, right pelvic - THREE BENIGN LYMPH NODES (0/3). 6. Lymph nodes, regional resection, left pelvic - THREE BENIGN LYMPH NODES (0/3).  Of note, she is BRCA negative.    Interval History: She presents today alone for continued surveillance.  She states she has been doing well.  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  Her family is doing well and her daughter with type 1 diabetes has received a new insulin pump that has improved monitoring and management of her blood sugar significantly. She reports occasional upper abdominal discomfort with exercise or strain in that area. Mammogram is up to date but she is asking given her family history and the comment about dense breasts on her last mammogram, if the 3D mammogram would be sufficient.  We also discussed her previous genetic testing and about discussing if further resting would be needed. No other concerns voiced. No concerning symptoms voiced.   Review of Systems: Constitutional: Feels well. No fever, chills. No early satiety, change in appetite. Cardiovascular: No chest pain, shortness of breath, or edema.  Pulmonary: No cough or wheeze.  Gastrointestinal: No nausea, vomiting, or diarrhea. No bright red blood per rectum or change in bowel movement.  Genitourinary: No frequency, urgency, or dysuria. No vaginal bleeding or discharge.  Musculoskeletal: No  myalgia or joint pain. Neurologic: No weakness, numbness, or change in gait.  Psychology: No depression, anxiety, or insomnia.  Health  Maintenance: Mammogram: Had 3D 03/05/2018 Pap Smear: 03/04/2017  Current Meds:  Outpatient Encounter Medications as of 09/07/2018  Medication Sig  . loperamide (IMODIUM A-D) 2 MG tablet Take 2 mg by mouth as needed for diarrhea or loose stools.  Marland Kitchen ibuprofen (ADVIL,MOTRIN) 800 MG tablet Take 1 tablet (800 mg total) by mouth 3 (three) times daily. (Patient not taking: Reported on 09/07/2018)  . meloxicam (MOBIC) 15 MG tablet Take 1 tablet (15 mg total) by mouth daily. (Patient not taking: Reported on 09/07/2018)   No facility-administered encounter medications on file as of 09/07/2018.     Allergy:  Allergies  Allergen Reactions  . Augmentin [Amoxicillin-Pot Clavulanate] Nausea And Vomiting    Can take Keflex  . Sulfa Antibiotics Nausea And Vomiting    Vomiting    Social Hx:   Social History   Socioeconomic History  . Marital status: Married    Spouse name: Not on file  . Number of children: Not on file  . Years of education: Not on file  . Highest education level: Not on file  Occupational History  . Not on file  Social Needs  . Financial resource strain: Not on file  . Food insecurity    Worry: Not on file    Inability: Not on file  . Transportation needs    Medical: Not on file    Non-medical: Not on file  Tobacco Use  . Smoking status: Never Smoker  . Smokeless tobacco: Never Used  Substance and Sexual Activity  . Alcohol use: Yes    Comment: rare social  . Drug use: No  . Sexual activity: Yes  Lifestyle  . Physical activity    Days per week: Not on file    Minutes per session: Not on file  . Stress: Not on file  Relationships  . Social Herbalist on phone: Not on file    Gets together: Not on file    Attends religious service: Not on file    Active member of club or organization: Not on file    Attends meetings of clubs or organizations: Not on file    Relationship status: Not on file  . Intimate partner violence    Fear of current or ex  partner: Not on file    Emotionally abused: Not on file    Physically abused: Not on file    Forced sexual activity: Not on file  Other Topics Concern  . Not on file  Social History Narrative  . Not on file    Past Surgical Hx:  Past Surgical History:  Procedure Laterality Date  . CERVICAL CONIZATION W/BX N/A 03/08/2013   Procedure: CONIZATION CERVIX WITH BIOPSY ;  Surgeon: Cheri Fowler, MD;  Location: [place] [facility];  Service: Gynecology;  Laterality: N/A;  1hr OR time  . childbirth     x 3 NVD  . DILATION AND CURETTAGE OF UTERUS    . ROBOTIC ASSISTED TOTAL HYSTERECTOMY WITH BILATERAL SALPINGO OOPHERECTOMY N/A 04/19/2013   Procedure: ROBOTIC ASSISTED RADICAL HYSTERECTOMY WITH BILATERAL SALPINGECTOMY AND PELVIC LYMPH NODE DISSECTION;  Surgeon: Imagene Gurney A. Alycia Rossetti, MD;  Location: [facility];  Service: Gynecology;  Laterality: N/A;    Past Medical Hx:  Past Medical History:  Diagnosis Date  . Cancer [facility])    dx. cervical cancer s/p 12'14 cone biopsy  . Stomach problems    "  stomach sensitive freq"    Family Hx:  Family History  Problem Relation Age of Onset  . Breast cancer Mother 52       stage IV, had for 30 years (Sybil Larimore)  . Cancer Father        prostate  . Hypertension Father   . Stroke Father   . Migraines Brother   . Cancer Brother 40       prostate  . Cancer Maternal Grandmother        breast   . Heart failure Paternal Grandfather        congestive  . Cancer Maternal Uncle        prostate cancer  . Cancer Cousin 40       Maternal colon cancer    Vitals:  Blood pressure (!) 98/58, pulse 73, temperature 98.2 F (36.8 C), temperature source Tympanic, resp. rate 16, height 5' 3.75" (1.619 m), weight 155 lb 3.2 oz (70.4 kg), last menstrual period 03/24/2013, SpO2 99 %.  Physical Exam: General: Well developed, well nourished female in no acute distress. Alert and oriented x 3.  Lymph node survey: No cervical, supraclavicular, or inguinal adenopathy.  Cardiovascular:  Regular rate and rhythm. S1 and S2 normal.  Lungs: Clear to auscultation bilaterally. No wheezes/crackles/rhonchi noted.  Skin: No rashes or lesions present. Abdomen: Abdomen soft, non-tender and non-obese. Active bowel sounds in all quadrants. No evidence of a fluid wave or abdominal masses. Lap sites well healed without nodularity or signs of herniation.  Genitourinary:    Vulva/vagina: Normal external female genitalia. No lesions.    Urethra: No lesions or masses    Vagina: Mildly atrophic without any lesions. No palpable masses. No vaginal bleeding noted. Minimal amount of white discharge present in the vagina. Thinprep pap obtained.  Rectal: Good tone, no masses, no cul de sac nodularity.  Extremities: No bilateral cyanosis, edema, or clubbing.   Assessment/Plan: 48 year old s/p total robotic radical hysterectomy, bilateral salpingectomies, bilateral pelvic lymph node dissection, bilateral oophoropexy on 04/19/2013 for a stage IB 1 adenocarcinoma cervix. She had no residual tumor in her cervix and negative lymph nodes.    No evidence of recurrence on today's exam. We will contact her with the results of her pap smear from today. We will reach out to Dr. Paulene Floor office to obtain her genetic testing results and then review them with our genetics counselor to see if additional testing would be warranted given her family history and the potential updated testing available.  We will also inquire about her breast screening to make sure it is adequate given her family history and having dense breast tissue. We will inform her about the above. Reportable signs and symptoms reviewed. She is advised to call to office closer to the date to make an appointment in one year or sooner if needed. She is advised to call for any questions or concerns.    Dorothyann Gibbs, NP 09/08/2018, 3:28 PM

## 2018-09-09 ENCOUNTER — Encounter: Payer: Self-pay | Admitting: Gynecologic Oncology

## 2018-09-10 LAB — CYTOLOGY - PAP: Diagnosis: NEGATIVE

## 2018-09-29 ENCOUNTER — Telehealth: Payer: Self-pay | Admitting: Oncology

## 2018-09-29 DIAGNOSIS — C531 Malignant neoplasm of exocervix: Secondary | ICD-10-CM

## 2018-09-29 NOTE — Telephone Encounter (Signed)
Called Dr. Paulene Floor office again about genetic results.  They will fax the records over.  If we do not receive them we can call back at [phone].

## 2018-09-29 NOTE — Telephone Encounter (Signed)
Called Stephanie Johnson and advised her that she may qualify for updated genetic testing due to her family history for breast and prostate cancer.  She is interested in scheduling a genetic counseling appointment.  Apt scheduled for 10/27/18 at 2 pm.  She verbalized understanding and agreement.

## 2018-10-12 ENCOUNTER — Other Ambulatory Visit: Payer: Self-pay

## 2018-10-12 ENCOUNTER — Ambulatory Visit (INDEPENDENT_AMBULATORY_CARE_PROVIDER_SITE_OTHER): Payer: BC Managed Care – PPO | Admitting: Family Medicine

## 2018-10-12 ENCOUNTER — Encounter: Payer: Self-pay | Admitting: Family Medicine

## 2018-10-12 ENCOUNTER — Telehealth: Payer: Self-pay | Admitting: *Deleted

## 2018-10-12 ENCOUNTER — Encounter: Payer: Self-pay | Admitting: Adult Health

## 2018-10-12 ENCOUNTER — Encounter: Payer: Self-pay | Admitting: Internal Medicine

## 2018-10-12 VITALS — Temp 97.3°F

## 2018-10-12 DIAGNOSIS — J029 Acute pharyngitis, unspecified: Secondary | ICD-10-CM | POA: Diagnosis not present

## 2018-10-12 DIAGNOSIS — Z20822 Contact with and (suspected) exposure to covid-19: Secondary | ICD-10-CM

## 2018-10-12 DIAGNOSIS — Z20828 Contact with and (suspected) exposure to other viral communicable diseases: Secondary | ICD-10-CM | POA: Diagnosis not present

## 2018-10-12 DIAGNOSIS — R197 Diarrhea, unspecified: Secondary | ICD-10-CM | POA: Diagnosis not present

## 2018-10-12 DIAGNOSIS — R6889 Other general symptoms and signs: Secondary | ICD-10-CM | POA: Diagnosis not present

## 2018-10-12 DIAGNOSIS — R509 Fever, unspecified: Secondary | ICD-10-CM | POA: Diagnosis not present

## 2018-10-12 NOTE — Telephone Encounter (Signed)
I called the pt and informed her the work note was completed and sent to her Mychart acct.  Appt scheduled for 7/28.

## 2018-10-12 NOTE — Progress Notes (Signed)
Virtual Visit via Video Note  I connected with Stephanie Johnson  on 10/12/18 at 12:40 PM EDT by a video enabled telemedicine application and verified that I am speaking with the correct person using two identifiers.  Location patient: home Location provider: home or work office Persons participating in the virtual visit: patient, provider  I discussed the limitations of evaluation and management by telemedicine and the availability of in person appointments. The patient expressed understanding and agreed to proceed.   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  Past Medical History:  Diagnosis Date  . Cancer [facility])    dx. cervical cancer s/p 12'14 cone biopsy  . Stomach problems    "stomach sensitive freq"    Past Surgical History:  Procedure Laterality Date  . CERVICAL CONIZATION W/BX N/A 03/08/2013   Procedure: CONIZATION CERVIX WITH BIOPSY ;  Surgeon: Cheri Fowler, MD;  Location: [place] [facility];  Service: Gynecology;  Laterality: N/A;  1hr OR time  . childbirth     x 3 NVD  . DILATION AND CURETTAGE OF UTERUS    . ROBOTIC ASSISTED TOTAL HYSTERECTOMY WITH BILATERAL SALPINGO OOPHERECTOMY N/A 04/19/2013   Procedure: ROBOTIC ASSISTED RADICAL HYSTERECTOMY WITH BILATERAL SALPINGECTOMY AND PELVIC LYMPH NODE DISSECTION;  Surgeon: Imagene Gurney A. Alycia Rossetti, MD;  Location: [facility];  Service: Gynecology;  Laterality: N/A;    Family History  Problem Relation Age of Onset  . Breast cancer Mother 35       stage IV, had for 30 years (Sybil Larimore)  .  Cancer Father        prostate  . Hypertension Father   . Stroke Father   . Migraines Brother   . Cancer Brother 40       prostate  . Cancer Maternal Grandmother        breast   . Heart failure Paternal Grandfather        congestive  . Cancer Maternal Uncle        prostate cancer  . Cancer Cousin 40       Maternal colon cancer    SOCIAL HX: see hpi   Current Outpatient Medications:  .  loperamide (IMODIUM A-D) 2 MG tablet, Take 2 mg by mouth as needed for diarrhea or loose stools., Disp: , Rfl:   EXAM:  VITALS per patient if applicable:  GENERAL: alert, oriented, appears well and in no acute distress  HEENT: atraumatic, conjunttiva clear, no obvious abnormalities on inspection of external nose and ears  NECK: normal movements of the head and neck  LUNGS: on inspection no signs of respiratory distress, breathing rate appears normal, no obvious gross SOB, gasping or wheezing  CV: no obvious cyanosis  MS: moves all visible extremities without noticeable abnormality  PSYCH/NEURO: pleasant and cooperative, no obvious depression or anxiety, speech and thought processing grossly intact  ASSESSMENT AND PLAN:  Discussed the following assessment and plan:  Diarrhea, unspecified type  Fever, unspecified fever cause  Sore throat   -we discussed possible serious and likely etiologies, workup and treatment, treatment risks and return precautions.  She wants CVOID19 testing. -after this discussion, [name] opted for COVID19 testing - orders placed, self/home isolation/quarantine discussed, symptomatic care, precautions, work slip, follow up in 5-7 days -of course, we advised [name]  to return or notify a doctor immediately if symptoms worsen or persist or new concerns arise.   Follow up instructions: Advised assistant Wendie Simmer to help patient arrange the following: -follow up in 5-7 days  [name], DO   Patient Instructions  Follow up: 5-7  Self Isolation/Home  Quarantine: -see the CDC site for information:   RunningShows.co.za.html   -STAY HOME except for to seek medical care -stay in your own room away from others in your house and use a separate bathroom if possible -Wash hands frequently, wear a mask if you leave your room and interact as little as possible with others -seek medical care immediately if worsening - call our office for a visit or call ahead if going elsewhere to an urgent care  -seek emergency care if very sick or severe symptoms - call 911 -isolate for at least 10 days from the onset of symptoms PLUS 3 days of no fever PLUS 3 days of improving symptoms  Novel Coronavirus Testing: I sent an order for coronavirus testing. No Appointment is needed.  Testing Sites:    [place] Location:                            [address], [place] (old [facility]) Hours:                                 8a-3:45p, M-F  [facility] Location:                           [address] [place], [place] [address]                                                              [place] ([place]) Hours:                                 8a-3:45p, M-F  [place] Location:                            [place], [place] (across from [place]) Hours:                                 8a-3:45p, M-F  Positive test. These tests are not 100% perfect, but if you tested positive for COVID-19, this confirms that you have contracted the SARS-CoV-2 virus. STAY HOME to complete full Quarantine per CDC guidelines.  Negative test. These tests are not 100% perfect but if you  tested negative for COVID-19, this indicates that you may not have contracted the SARS-CoV-2 virus. Follow your doctor's recommendations and the CDC guidelines.   WORK SLIP:  Please excuse patient Stephanie Johnson,  April 28, 1970,  from work according to the [employer] guidelines for a COVID like illness. We advise 10 days minimum from the onset of symptoms (10/11/2018) PLUS 3 days of no fever PLUS 3 days of felling better.   Sincerely: E-signature: Dr. Colin Benton, DO [place] Ph: [phone]

## 2018-10-12 NOTE — Patient Instructions (Addendum)
Follow up: 5-7  Self Isolation/Home Quarantine: -see the CDC site for information:   RunningShows.co.za.html   -STAY HOME except for to seek medical care -stay in your own room away from others in your house and use a separate bathroom if possible -Wash hands frequently, wear a mask if you leave your room and interact as little as possible with others -seek medical care immediately if worsening - call our office for a visit or call ahead if going elsewhere to an urgent care  -seek emergency care if very sick or severe symptoms - call 911 -isolate for at least 10 days from the onset of symptoms PLUS 3 days of no fever PLUS 3 days of improving symptoms  Novel Coronavirus Testing: I sent an order for coronavirus testing. No Appointment is needed.  Testing Sites:    [place] Location:                            [address] [place] (old [facility]) Hours:                                 8a-3:45p, M-F  [facility] Location:                           [address], [place], [place] [address]                                                              [place] ([place]) Hours:                                 8a-3:45p, M-F  [facility] Location:                            [place], [place] (across from [place]) Hours:                                 8a-3:45p, M-F  Positive test. These tests are not 100% perfect, but if you tested positive for COVID-19, this confirms that you have contracted the SARS-CoV-2 virus. STAY HOME to complete full Quarantine per CDC guidelines.  Negative test. These tests are not 100% perfect but if you tested negative for COVID-19, this indicates that you may not have contracted the SARS-CoV-2 virus. Follow your doctor's recommendations and the CDC guidelines.   WORK SLIP:  Please  excuse patient Stephanie Johnson,  01-Jun-1970, from work according to the [employer] guidelines for a COVID like illness. We advise 10 days minimum from the onset of symptoms (10/11/2018) PLUS 3 days of no fever PLUS 3 days of feeling better.  She/He may work remotely from home in self isolation  if she is feeling better and wishes to do so.  Sincerely: E-signature: Dr. Colin Benton, DO [place] Ph: [phone]

## 2018-10-12 NOTE — Telephone Encounter (Signed)
-----   Message from [name], DO sent at 10/12/2018  1:15 PM EDT ----- Follow up in 5-7 daysWork slip is in pat instructions

## 2018-10-12 NOTE — Telephone Encounter (Signed)
Copied from [place] [id number]. Topic: General - Other >> Oct 12, 2018  8:21 AM Parke Poisson wrote: Reason for CRM: Pt has had low grade fever,diarrhea and fatigue.She would like to get tested for covid. She also wants to know if her family should quarantine. They have no symptoms >> Oct 12, 2018  9:28 AM Cox, Melburn Hake, CMA wrote: Please schedule virtual visit ASAP  Virtual appointment scheduled with Dr. Maudie Mercury for today at 12:40PM

## 2018-10-14 ENCOUNTER — Ambulatory Visit (INDEPENDENT_AMBULATORY_CARE_PROVIDER_SITE_OTHER): Payer: BC Managed Care – PPO | Admitting: Family Medicine

## 2018-10-14 ENCOUNTER — Encounter: Payer: Self-pay | Admitting: Family Medicine

## 2018-10-14 ENCOUNTER — Other Ambulatory Visit: Payer: Self-pay

## 2018-10-14 DIAGNOSIS — R5383 Other fatigue: Secondary | ICD-10-CM | POA: Diagnosis not present

## 2018-10-14 DIAGNOSIS — W57XXXA Bitten or stung by nonvenomous insect and other nonvenomous arthropods, initial encounter: Secondary | ICD-10-CM

## 2018-10-14 DIAGNOSIS — T148XXA Other injury of unspecified body region, initial encounter: Secondary | ICD-10-CM | POA: Diagnosis not present

## 2018-10-14 DIAGNOSIS — M2550 Pain in unspecified joint: Secondary | ICD-10-CM | POA: Diagnosis not present

## 2018-10-14 LAB — NOVEL CORONAVIRUS, NAA: SARS-CoV-2, NAA: NOT DETECTED

## 2018-10-14 MED ORDER — DOXYCYCLINE HYCLATE 100 MG PO TABS: 100.0000 mg | ORAL_TABLET | Freq: Two times a day (BID) | ORAL | 0 refills | Status: DC

## 2018-10-14 NOTE — Progress Notes (Signed)
Virtual Visit via Video Note  I connected with Stephanie Johnson  on 10/14/18 at 12:20 PM EDT by a video enabled telemedicine application and verified that I am speaking with the correct person using two identifiers.  Location patient: home Location provider:work or home office Persons participating in the virtual visit: patient, provider  I discussed the limitations of evaluation and management by telemedicine and the availability of in person appointments. The patient expressed understanding and agreed to proceed.   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  Past Medical History:  Diagnosis Date  . Cancer [facility])    dx. cervical cancer s/p 12'14 cone biopsy  . Stomach problems    "stomach sensitive freq"    Past Surgical History:  Procedure Laterality Date  . CERVICAL CONIZATION W/BX N/A 03/08/2013   Procedure: CONIZATION CERVIX WITH BIOPSY ;  Surgeon: Cheri Fowler, MD;  Location: [facility];  Service: Gynecology;  Laterality: N/A;  1hr OR time  . childbirth     x 3 NVD  . DILATION AND CURETTAGE OF UTERUS    . ROBOTIC ASSISTED TOTAL HYSTERECTOMY WITH BILATERAL SALPINGO OOPHERECTOMY N/A 04/19/2013   Procedure: ROBOTIC ASSISTED RADICAL HYSTERECTOMY WITH BILATERAL SALPINGECTOMY AND PELVIC LYMPH NODE DISSECTION;  Surgeon: Imagene Gurney A. Alycia Rossetti, MD;  Location: [facility];  Service:  Gynecology;  Laterality: N/A;    Family History  Problem Relation Age of Onset  . Breast cancer Mother 41       stage IV, had for 30 years ([place])  . Cancer Father        prostate  . Hypertension Father   . Stroke Father   . Migraines Brother   . Cancer Brother 40       prostate  . Cancer Maternal Grandmother        breast   . Heart failure Paternal Grandfather        congestive  . Cancer Maternal Uncle        prostate cancer  . Cancer Cousin 40       Maternal colon cancer    SOCIAL HX: see hpi   Current Outpatient Medications:  .  loperamide (IMODIUM A-D) 2 MG tablet, Take 2 mg by mouth as needed for diarrhea or loose stools., Disp: , Rfl:  .  doxycycline (VIBRA-TABS) 100 MG tablet, Take 1 tablet (100 mg total) by mouth 2 (two) times daily., Disp: 30 tablet, Rfl: 0  EXAM:  VITALS per patient if applicable:  GENERAL: alert, oriented, appears well and in no acute distress  HEENT: atraumatic, conjunttiva clear, no obvious abnormalities on inspection of external nose and ears  NECK: normal movements of the head and neck  LUNGS: on inspection no signs of respiratory distress, breathing rate appears normal, no obvious gross SOB, gasping or wheezing  CV: no obvious cyanosis  MS: moves all visible extremities without noticeable abnormality  PSYCH/NEURO:  pleasant and cooperative, no obvious depression or anxiety, speech and thought processing grossly intact  ASSESSMENT AND PLAN:  Discussed the following assessment and plan:  Tick bite, initial encounter  Polyarthralgia  Fatigue, unspecified type  -we discussed possible serious and likely etiologies, workup and treatment, treatment risks and return precautions. She initially was concerned symptoms could be COVID19, but testing was negative. She now is concerned about tick borne illness. She has had a number of tick bites, including with deer ticks, and is unsure of length of attachment. Discussed signs and  symptoms of tick borne illness, options for evaluation, treatment options, risks.  -after this discussion, Sivan opted for empiric tx with doxy 100mg  bid x 10-14 days for possible early lyme.  -follow up advised  if symptoms worsen or persist or new concerns arise. -she needs new pt visit with a provider taking new patients as her PCP has retired  I discussed the assessment and treatment plan with the patient. The patient was provided an opportunity to ask questions and all were answered. The patient agreed with the plan and demonstrated an understanding of the instructions.      Follow up instructions: Advised assistant Wendie Simmer to help patient arrange the following: -cancel appt with me for follow up next week -new PCP appt  Stephanie Kern, DO

## 2018-10-15 ENCOUNTER — Telehealth: Payer: Self-pay | Admitting: *Deleted

## 2018-10-15 DIAGNOSIS — R5383 Other fatigue: Secondary | ICD-10-CM | POA: Diagnosis not present

## 2018-10-15 DIAGNOSIS — R509 Fever, unspecified: Secondary | ICD-10-CM | POA: Diagnosis not present

## 2018-10-15 DIAGNOSIS — Z20828 Contact with and (suspected) exposure to other viral communicable diseases: Secondary | ICD-10-CM | POA: Diagnosis not present

## 2018-10-15 NOTE — Telephone Encounter (Signed)
-----   Message from [name], DO sent at 10/14/2018  7:38 PM EDT ----- -cancel appt with me for follow up next week-new PCP appt with doc taking new patients- PCP has retired

## 2018-10-15 NOTE — Telephone Encounter (Signed)
Appt already cancelled per pts request on 7/23.  I called the pt and she stated she will call back to schedule an appt with PCP of her choice.

## 2018-10-19 ENCOUNTER — Ambulatory Visit: Payer: BC Managed Care – PPO | Admitting: Family Medicine

## 2018-10-26 ENCOUNTER — Telehealth: Payer: Self-pay | Admitting: Genetic Counselor

## 2018-10-26 NOTE — Telephone Encounter (Signed)
Called patient regarding upcoming [facility] appointment, left a voicemail. This will be considered a walk-in visit due to no communication to set it up as virtual.

## 2018-10-26 NOTE — Telephone Encounter (Signed)
Left VM to confirm appt and verify info °

## 2018-10-27 ENCOUNTER — Encounter: Payer: Self-pay | Admitting: Genetic Counselor

## 2018-10-27 ENCOUNTER — Other Ambulatory Visit: Payer: Self-pay

## 2018-10-27 ENCOUNTER — Inpatient Hospital Stay: Payer: BC Managed Care – PPO

## 2018-10-27 ENCOUNTER — Other Ambulatory Visit: Payer: Self-pay | Admitting: Genetic Counselor

## 2018-10-27 ENCOUNTER — Inpatient Hospital Stay: Payer: BC Managed Care – PPO | Attending: Genetic Counselor | Admitting: Genetic Counselor

## 2018-10-27 DIAGNOSIS — C531 Malignant neoplasm of exocervix: Secondary | ICD-10-CM

## 2018-10-27 DIAGNOSIS — Z8042 Family history of malignant neoplasm of prostate: Secondary | ICD-10-CM

## 2018-10-27 DIAGNOSIS — Z803 Family history of malignant neoplasm of breast: Secondary | ICD-10-CM | POA: Insufficient documentation

## 2018-10-27 DIAGNOSIS — Z800 Family history of malignant neoplasm of digestive organs: Secondary | ICD-10-CM | POA: Diagnosis not present

## 2018-10-27 DIAGNOSIS — Z8541 Personal history of malignant neoplasm of cervix uteri: Secondary | ICD-10-CM | POA: Diagnosis not present

## 2018-10-27 NOTE — Progress Notes (Signed)
REFERRING PROVIDER: Dorothyann Gibbs, NP [place],  [place] [address]  PRIMARY PROVIDER:  Marletta Lor, MD  PRIMARY REASON FOR VISIT:  1. Malignant neoplasm of exocervix ([place])   2. Family history of breast cancer   3. Family history of prostate cancer   4. Family history of colon cancer      HISTORY OF PRESENT ILLNESS:   Ms. Stephanie Johnson, a 48 y.o. female, was seen for a [place] cancer genetics consultation at the request of Dr. Elinor Johnson due to a personal and family history of cancer.  Ms. Stephanie Johnson presents to clinic today to discuss the possibility of a hereditary predisposition to cancer, genetic testing, and to further clarify her future cancer risks, as well as potential cancer risks for family members.   In December 2014, at the age of 30, Ms. Stephanie Johnson was diagnosed with cancer of the cervix. The treatment plan included hysterectomy. Her mother declined genetic testing and therefore Ms. Stephanie Johnson underwent genetic testing in 2014 for BRCA1 and BRCA2 mutations only.  This was performed through [employer] and was negative.    CANCER HISTORY:  Oncology History  Cervical cancer ([place])  03/08/2013 Initial Diagnosis   Cervical cancer, CKC adenocarcnoma   04/19/2013 Surgery   robotic radical hysterectomy. No residual. Final stage IB adenocarcinoma of the cervix. 0/6 nodes      Past Medical History:  Diagnosis Date  . Cancer [facility])    dx. cervical cancer s/p 12'14 cone biopsy  . Family history of breast cancer   . Family history of colon cancer   . Family history of prostate cancer   . Stomach problems    "stomach sensitive freq"    Past Surgical History:  Procedure Laterality Date  . CERVICAL CONIZATION W/BX N/A 03/08/2013   Procedure: CONIZATION CERVIX WITH BIOPSY ;  Surgeon: Stephanie Fowler, MD;  Location: [facility];  Service: Gynecology;  Laterality: N/A;  1hr OR time  . childbirth     x 3 NVD  . DILATION AND CURETTAGE OF UTERUS    . ROBOTIC ASSISTED TOTAL  HYSTERECTOMY WITH BILATERAL SALPINGO OOPHERECTOMY N/A 04/19/2013   Procedure: ROBOTIC ASSISTED RADICAL HYSTERECTOMY WITH BILATERAL SALPINGECTOMY AND PELVIC LYMPH NODE DISSECTION;  Surgeon: Stephanie Gurney A. Alycia Rossetti, MD;  Location: [facility];  Service: Gynecology;  Laterality: N/A;    Social History   Socioeconomic History  . Marital status: Married    Spouse name: Not on file  . Number of children: Not on file  . Years of education: Not on file  . Highest education level: Not on file  Occupational History  . Not on file  Social Needs  . Financial resource strain: Not on file  . Food insecurity    Worry: Not on file    Inability: Not on file  . Transportation needs    Medical: Not on file    Non-medical: Not on file  Tobacco Use  . Smoking status: Never Smoker  . Smokeless tobacco: Never Used  Substance and Sexual Activity  . Alcohol use: Yes    Comment: rare social  . Drug use: No  . Sexual activity: Yes  Lifestyle  . Physical activity    Days per week: Not on file    Minutes per session: Not on file  . Stress: Not on file  Relationships  . Social Herbalist on phone: Not on file    Gets together: Not on file    Attends religious service: Not on  file    Active member of club or organization: Not on file    Attends meetings of clubs or organizations: Not on file    Relationship status: Not on file  Other Topics Concern  . Not on file  Social History Narrative  . Not on file     FAMILY HISTORY:  We obtained a detailed, 4-generation family history.  Significant diagnoses are listed below: Family History  Problem Relation Age of Onset  . Breast cancer Mother 13       stage IV, had for 30 years (Stephanie Johnson)  . Cancer Father        prostate  . Hypertension Father   . Stroke Father   . Migraines Brother   . Cancer Brother 40       prostate  . Cancer Maternal Grandmother 65       breast   . Heart failure Paternal Grandfather 40       congestive  . Cancer  Maternal Uncle        prostate cancer  . Cancer Cousin 40       Maternal colon cancer  . Dementia Paternal Aunt   . Leukemia Maternal Grandfather   . Cancer Paternal Grandmother        unknown    The patient has three daughters who are cancer free. She has one brother who was diagnosed with prostate cancer at 41.  She is unaware of whether he had genetic testing.  Both parents are deceased.  The patient's mother was diagnosed with breast cancer in her early 30's and then again in her 55's.  She developed metastatic breast cancer in her 37's and died in her early 80's.  She has one brother who is living and has had prostate cancer.  The patient's maternal grandmother had breast cancer in her 46's-80's.  The patient's father was diagnosed with prostate cancer in his 37's.  He had two sisters who did not have cancer.  The paternal grandmother had an unknown cancer.  Ms. Stephanie Johnson is unaware of previous family history of genetic testing for hereditary cancer risks. Patient's maternal ancestors are of [place] and [place] descent, and paternal ancestors are of [place] descent. There is no reported Ashkenazi Jewish ancestry. There is no known consanguinity.  GENETIC COUNSELING ASSESSMENT: [name] is a 48 y.o. female with a family history of breast and prostate cancer which is somewhat suggestive of a hereditary breast and ovarian cancer syndrome and predisposition to cancer given her mother and brothers very early cancer. We, therefore, discussed and recommended the following at today's visit.   DISCUSSION: We discussed that 5 - 10% of breast cancer, and 15% of prostate cancer is hereditary, with most cases associated with BRCA mutations.  There are other genes that can be associated with hereditary breast and prostate cancer syndromes.  These include ATM, CHEK2 and PALB2.  The patient tested negative for BRCA1 and BRCA2 in 2014.  This was the time when panel testing was just starting to be  conducted.  There is a chance that there is a BRCA mutation running in the family and that the patient did not inherit it.  Therefore, her brother should undergo genetic testing given his very early onset of prostate cancer.  We discussed that testing is beneficial for several reasons including knowing how to follow individuals after completing their treatment, and understand if other family members could be at risk for cancer and allow them to undergo genetic testing.  We reviewed the characteristics, features and inheritance patterns of hereditary cancer syndromes. We also discussed genetic testing, including the appropriate family members to test, the process of testing, insurance coverage and turn-around-time for results. We discussed the implications of a negative, positive and/or variant of uncertain significant result. We recommended Ms. Granderson pursue genetic testing for the CancerNext+RNAinsight gene panel. The CancerNext gene panel offered by [employer] includes sequencing and rearrangement analysis for the following 34 genes:   APC, ATM, BARD1, BMPR1A, BRCA1, BRCA2, BRIP1, CDH1, CDK4, CDKN2A, CHEK2, DICER1, HOXB13, EPCAM, GREM1, MLH1, MRE11A, MSH2, MSH6, MUTYH, NBN, NF1, PALB2, PMS2, POLD1, POLE, PTEN, RAD50, RAD51C, RAD51D, SMAD4, SMARCA4, STK11, and TP53.    Based on Ms. Desjardin's personal and family history of cancer, she meets medical criteria for genetic testing. Despite that she meets criteria, she may still have an out of pocket cost. We discussed that if her out of pocket cost for testing is over $100, the laboratory will call and confirm whether she wants to proceed with testing.  If the out of pocket cost of testing is less than $100 she will be billed by the genetic testing laboratory.   PLAN: After considering the risks, benefits, and limitations, Ms. Jobst provided informed consent to pursue genetic testing and the blood sample was sent to [facility] for analysis of the  CancerNext+RNAinsight. Results should be available within approximately 2-3 weeks' time, at which point they will be disclosed by telephone to Ms. Mckethan, as will any additional recommendations warranted by these results. Ms. Garside will receive a summary of her genetic counseling visit and a copy of her results once available. This information will also be available in Epic.   Based on Ms. Shima's family history, we recommended her brother, who was diagnosed with prostate cancer at age 88, have genetic counseling and testing. Ms. Milone will let us know if we can be of any assistance in coordinating genetic counseling and/or testing for this family member.   Lastly, we encouraged Ms. Sammons to remain in contact with cancer genetics annually so that we can continuously update the family history and inform her of any changes in cancer genetics and testing that may be of benefit for this family.   Ms. Sommerfield's questions were answered to her satisfaction today. Our contact information was provided should additional questions or concerns arise. Thank you for the referral and allowing [place] to share in the care of your patient.   Bartlomiej Jenkinson P. Florene Glen, [place], [facility] Licensed, Insurance risk surveyor [email or web address] phone: [phone]  The patient was seen for a total of 60 minutes in face-to-face genetic counseling.  This patient was discussed with Drs. Magrinat, Lindi Adie and/or Burr Medico who agrees with the above.    _______________________________________________________________________ For Office Staff:  Number of people involved in session: 1 Was an Intern/ student involved with case: no

## 2018-11-03 DIAGNOSIS — M25571 Pain in right ankle and joints of right foot: Secondary | ICD-10-CM | POA: Diagnosis not present

## 2018-11-11 ENCOUNTER — Telehealth: Payer: Self-pay | Admitting: Genetic Counselor

## 2018-11-11 ENCOUNTER — Encounter: Payer: Self-pay | Admitting: Genetic Counselor

## 2018-11-11 DIAGNOSIS — Z1379 Encounter for other screening for genetic and chromosomal anomalies: Secondary | ICD-10-CM | POA: Insufficient documentation

## 2018-11-11 NOTE — Telephone Encounter (Signed)
LM on VM that results are back and to please call.  Left CB number. 

## 2018-11-16 NOTE — Telephone Encounter (Signed)
Left another message on VM and sent a secure email to her email address letting her know that her results are back and to please call.

## 2018-11-17 DIAGNOSIS — S93401A Sprain of unspecified ligament of right ankle, initial encounter: Secondary | ICD-10-CM | POA: Diagnosis not present

## 2018-11-18 ENCOUNTER — Encounter: Payer: Self-pay | Admitting: Genetic Counselor

## 2018-11-18 ENCOUNTER — Telehealth: Payer: Self-pay | Admitting: Genetic Counselor

## 2018-11-18 NOTE — Telephone Encounter (Signed)
LM on VM that results are back and to please call.  Left CB instructions. 

## 2019-06-08 ENCOUNTER — Encounter: Payer: Self-pay | Admitting: Family Medicine

## 2019-06-08 ENCOUNTER — Other Ambulatory Visit: Payer: Self-pay

## 2019-06-08 ENCOUNTER — Ambulatory Visit (INDEPENDENT_AMBULATORY_CARE_PROVIDER_SITE_OTHER): Payer: BC Managed Care – PPO | Admitting: Family Medicine

## 2019-06-08 ENCOUNTER — Encounter (INDEPENDENT_AMBULATORY_CARE_PROVIDER_SITE_OTHER): Payer: Self-pay | Admitting: Family

## 2019-06-08 VITALS — BP 118/60 | HR 105 | Temp 97.6°F | Wt 155.4 lb

## 2019-06-08 DIAGNOSIS — S39012A Strain of muscle, fascia and tendon of lower back, initial encounter: Secondary | ICD-10-CM

## 2019-06-08 MED ORDER — IBUPROFEN 800 MG PO TABS: 800.0000 mg | ORAL_TABLET | Freq: Three times a day (TID) | ORAL | 0 refills | Status: DC | PRN

## 2019-06-08 NOTE — Progress Notes (Signed)
Subjective:     Patient ID: Stephanie Johnson, female   DOB: 06-Aug-1970, 49 y.o.   MRN: [id number]  HPI Stephanie Johnson is seen with low back pain.  Onset yesterday morning.  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  She was doing her usual chores with feeding the horses and afterwards when driving back home noticed some pain left lower lumbar area.  She took 800 mg Motrin at work which seemed to help.  Then last night she was also feeding her horses and she slipped on some mud.  She had some worsening pain afterwards.  She describes achy pain left lower lumbar area.  No radiculitis symptoms.  She has some mild achiness in the upper thigh.  No lower extremity numbness or weakness.  Pain is 3 out of 10 at rest and goes up to 6-8 out of 10 with movement.  Heat helps.  No prior history of major back difficulties.  She has had kidney stones in the past and this pain is much different.  She has had past history of cervix cancer but this was several years ago.  No recent appetite or weight changes.  No fevers or chills.  No dysuria.  Past Medical History:  Diagnosis Date  . Cancer [facility])    dx. cervical cancer s/p 12'14 cone biopsy  . Family history of breast cancer   . Family history of colon cancer   . Family history of prostate cancer   . Stomach problems    "stomach sensitive freq"   Past Surgical History:  Procedure Laterality Date  . CERVICAL CONIZATION W/BX N/A 03/08/2013   Procedure: CONIZATION CERVIX WITH BIOPSY ;  Surgeon: Cheri Fowler, MD;  Location: [place] [facility] [place] [facility];  Service: Gynecology;  Laterality: N/A;  1hr OR time  . childbirth     x 3 NVD  . DILATION AND CURETTAGE OF UTERUS    . ROBOTIC ASSISTED TOTAL HYSTERECTOMY WITH BILATERAL SALPINGO OOPHERECTOMY N/A 04/19/2013   Procedure: ROBOTIC ASSISTED RADICAL HYSTERECTOMY WITH BILATERAL SALPINGECTOMY AND PELVIC LYMPH NODE DISSECTION;  Surgeon: Imagene Gurney A. Alycia Rossetti, MD;  Location: [facility];  Service: Gynecology;  Laterality: N/A;    reports that she has never smoked. She has never used smokeless tobacco. She reports current alcohol use. She reports that she does not use drugs. family history includes Breast cancer (age of onset: 8) in her mother; Cancer in her father, maternal uncle, and paternal grandmother; Cancer (age of onset: 22) in her brother and cousin; Cancer (age of onset: 106) in her maternal grandmother; Dementia in her paternal aunt; Heart failure (age of onset: 64) in her paternal grandfather; Hypertension in her father; Leukemia in her maternal grandfather; Migraines in her brother; Stroke in her father. Allergies  Allergen Reactions  . Augmentin [Amoxicillin-Pot Clavulanate] Nausea And Vomiting    Can take Keflex  . Sulfa Antibiotics Nausea And Vomiting    Vomiting     Review of Systems  Constitutional: Negative for appetite change, chills, fever and unexpected weight change.  Gastrointestinal: Negative for abdominal pain.  Genitourinary: Negative for dysuria.  Musculoskeletal: Positive for back pain.  Neurological: Negative for weakness and numbness.       Objective:   Physical Exam Vitals reviewed.  Constitutional:      Appearance: Normal appearance.  Cardiovascular:     Rate and Rhythm: Normal rate and regular rhythm.  Musculoskeletal:     Comments: Straight leg raises are negative.  She has some tenderness left lower  lumbar region to palpation.  No spinal tenderness.  Neurological:     Mental Status: She is alert.     Comments: Full strength lower extremities.  Deep tendon reflexes are symmetric.        Assessment:     Acute left lumbar back pain.  Suspect musculoskeletal.  Nonfocal neuro exam    Plan:     -Heat or ice for symptom relief -Reviewed appropriate stretches -Limited ibuprofen/Motrin up to 800 mg every 8 hours as needed -Avoid heavy lifting or stooping at the waist -Touch base if not improving over the next couple weeks with the above -She has access to methocarbamol  500 mg every 8 hours as needed for muscle spasm  Eulas Post MD [facility] at [facility]'

## 2019-06-08 NOTE — Patient Instructions (Signed)
Acute Back Pain, Adult Acute back pain is sudden and usually short-lived. It is often caused by an injury to the muscles and tissues in the back. The injury may result from:  A muscle or ligament getting overstretched or torn (strained). Ligaments are tissues that connect bones to each other. Lifting something improperly can cause a back strain.  Wear and tear (degeneration) of the spinal disks. Spinal disks are circular tissue that provides cushioning between the bones of the spine (vertebrae).  Twisting motions, such as while playing sports or doing yard work.  A hit to the back.  Arthritis. You may have a physical exam, lab tests, and imaging tests to find the cause of your pain. Acute back pain usually goes away with rest and home care. Follow these instructions at home: Managing pain, stiffness, and swelling  Take over-the-counter and prescription medicines only as told by your health care provider.  Your health care provider may recommend applying ice during the first 24-48 hours after your pain starts. To do this: ? Put ice in a plastic bag. ? Place a towel between your skin and the bag. ? Leave the ice on for 20 minutes, 2-3 times a day.  If directed, apply heat to the affected area as often as told by your health care provider. Use the heat source that your health care provider recommends, such as a moist heat pack or a heating pad. ? Place a towel between your skin and the heat source. ? Leave the heat on for 20-30 minutes. ? Remove the heat if your skin turns bright red. This is especially important if you are unable to feel pain, heat, or cold. You have a greater risk of getting burned. Activity   Do not stay in bed. Staying in bed for more than 1-2 days can delay your recovery.  Sit up and stand up straight. Avoid leaning forward when you sit, or hunching over when you stand. ? If you work at a desk, sit close to it so you do not need to lean over. Keep your chin tucked  in. Keep your neck drawn back, and keep your elbows bent at a right angle. Your arms should look like the letter "L." ? Sit high and close to the steering wheel when you drive. Add lower back (lumbar) support to your car seat, if needed.  Take short walks on even surfaces as soon as you are able. Try to increase the length of time you walk each day.  Do not sit, drive, or stand in one place for more than 30 minutes at a time. Sitting or standing for long periods of time can put stress on your back.  Do not drive or use heavy machinery while taking prescription pain medicine.  Use proper lifting techniques. When you bend and lift, use positions that put less stress on your back: ? Bend your knees. ? Keep the load close to your body. ? Avoid twisting.  Exercise regularly as told by your health care provider. Exercising helps your back heal faster and helps prevent back injuries by keeping muscles strong and flexible.  Work with a physical therapist to make a safe exercise program, as recommended by your health care provider. Do any exercises as told by your physical therapist. Lifestyle  Maintain a healthy weight. Extra weight puts stress on your back and makes it difficult to have good posture.  Avoid activities or situations that make you feel anxious or stressed. Stress and anxiety increase muscle   tension and can make back pain worse. Learn ways to manage anxiety and stress, such as through exercise. General instructions  Sleep on a firm mattress in a comfortable position. Try lying on your side with your knees slightly bent. If you lie on your back, put a pillow under your knees.  Follow your treatment plan as told by your health care provider. This may include: ? Cognitive or behavioral therapy. ? Acupuncture or massage therapy. ? Meditation or yoga. Contact a health care provider if:  You have pain that is not relieved with rest or medicine.  You have increasing pain going down  into your legs or buttocks.  Your pain does not improve after 2 weeks.  You have pain at night.  You lose weight without trying.  You have a fever or chills. Get help right away if:  You develop new bowel or bladder control problems.  You have unusual weakness or numbness in your arms or legs.  You develop nausea or vomiting.  You develop abdominal pain.  You feel faint. Summary  Acute back pain is sudden and usually short-lived.  Use proper lifting techniques. When you bend and lift, use positions that put less stress on your back.  Take over-the-counter and prescription medicines and apply heat or ice as directed by your health care provider. This information is not intended to replace advice given to you by your health care provider. Make sure you discuss any questions you have with your health care provider. Document Revised: 06/29/2018 Document Reviewed: 10/22/2016 Elsevier Patient Education  [place].  Consider muscle relaxer (Robaxin) 500 mg at night as needed.   May take Ibuprofen up to 800 mg every 8 hours as needed and take with food.

## 2019-10-21 ENCOUNTER — Other Ambulatory Visit: Payer: Self-pay

## 2019-10-21 ENCOUNTER — Ambulatory Visit (INDEPENDENT_AMBULATORY_CARE_PROVIDER_SITE_OTHER): Payer: BC Managed Care – PPO | Admitting: Family Medicine

## 2019-10-21 ENCOUNTER — Encounter: Payer: Self-pay | Admitting: Family Medicine

## 2019-10-21 VITALS — BP 128/70 | HR 81 | Temp 98.2°F | Resp 12 | Ht 63.75 in | Wt 159.4 lb

## 2019-10-21 DIAGNOSIS — Z1211 Encounter for screening for malignant neoplasm of colon: Secondary | ICD-10-CM | POA: Diagnosis not present

## 2019-10-21 DIAGNOSIS — Z13220 Encounter for screening for lipoid disorders: Secondary | ICD-10-CM | POA: Diagnosis not present

## 2019-10-21 DIAGNOSIS — Z579 Occupational exposure to unspecified risk factor: Secondary | ICD-10-CM

## 2019-10-21 DIAGNOSIS — Z1329 Encounter for screening for other suspected endocrine disorder: Secondary | ICD-10-CM | POA: Diagnosis not present

## 2019-10-21 DIAGNOSIS — Z1159 Encounter for screening for other viral diseases: Secondary | ICD-10-CM

## 2019-10-21 DIAGNOSIS — Z0000 Encounter for general adult medical examination without abnormal findings: Secondary | ICD-10-CM | POA: Diagnosis not present

## 2019-10-21 DIAGNOSIS — Z130 Encounter for screening for diseases of the blood and blood-forming organs and certain disorders involving the immune mechanism: Secondary | ICD-10-CM

## 2019-10-21 DIAGNOSIS — Z131 Encounter for screening for diabetes mellitus: Secondary | ICD-10-CM | POA: Diagnosis not present

## 2019-10-21 DIAGNOSIS — Z13228 Encounter for screening for other metabolic disorders: Secondary | ICD-10-CM

## 2019-10-21 NOTE — Progress Notes (Signed)
HPI: [name] is a 49 y.o. female, who is here today to establish care.  Former PCP: Dr Burnice Logan Last preventive routine visit: > a year ago.  Chronic medical problems: Cervical cancer s/p hysterectomy. She follows with oncologist annually.  Concerns today: None. She needs a CPE. 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  She lives with her husband and children.  She does not exercise regularly but she is active at work. Not consistent with following a healthful diet, skips lunch sometimes due to her work schedule.  Colon cancer screening: Never. Mammogram: 02/2018.  Immunization History  Administered Date(s) Administered  . Influenza-Unspecified 02/24/2017  . Tdap 01/02/2017    Review of Systems  Constitutional: Negative for appetite change, fatigue and fever.  HENT: Negative for hearing loss, mouth sores and sore throat.   Eyes: Negative for redness and visual disturbance.  Respiratory: Negative for cough, shortness of breath and wheezing.   Cardiovascular: Negative for chest pain and leg swelling.  Gastrointestinal: Negative for abdominal pain, nausea and vomiting.       No changes in bowel habits.  Endocrine: Negative for cold intolerance, heat intolerance, polydipsia, polyphagia and polyuria.  Genitourinary: Negative for decreased urine volume, dysuria, hematuria, vaginal bleeding and vaginal discharge.  Musculoskeletal: Negative for gait problem and myalgias.  Skin: Negative for color change and rash.  Allergic/Immunologic: Negative for environmental allergies.  Neurological: Negative for syncope, weakness and headaches.  Hematological: Negative for adenopathy. Does not bruise/bleed easily.  Psychiatric/Behavioral: Negative for confusion and sleep disturbance. The patient is not nervous/anxious.   All other systems reviewed and are negative.  No current outpatient medications on file prior to visit.   No current facility-administered medications  on file prior to visit.   Past Medical History:  Diagnosis Date  . Cancer [facility])    dx. cervical cancer s/p 12'14 cone biopsy  . Family history of breast cancer   . Family history of colon cancer   . Family history of prostate cancer   . Stomach problems    "stomach sensitive freq"   Allergies  Allergen Reactions  . Augmentin [Amoxicillin-Pot Clavulanate] Nausea And Vomiting    Can take Keflex  . Sulfa Antibiotics Nausea And Vomiting    Vomiting    Family History  Problem Relation Age of Onset  . Breast cancer Mother 60       stage IV, had for 30 years (Sybil Larimore)  . Cancer Father        prostate  . Hypertension Father   . Stroke Father   . Migraines Brother   . Cancer Brother 40       prostate  . Cancer Maternal Grandmother 89       breast   . Heart failure Paternal Grandfather 40       congestive  . Cancer Maternal Uncle        prostate cancer  . Cancer Cousin 40       Maternal colon cancer  . Dementia Paternal Aunt   . Leukemia Maternal Grandfather   . Cancer Paternal Grandmother        unknown  . Diabetes Daughter 6       DM I    Social History   Socioeconomic History  . Marital status: Married    Spouse name: Not on file  . Number of children: Not on file  . Years of education: Not on file  . Highest education level: Not on file  Occupational  History  . Not on file  Tobacco Use  . Smoking status: Never Smoker  . Smokeless tobacco: Never Used  Vaping Use  . Vaping Use: Never used  Substance and Sexual Activity  . Alcohol use: Yes    Comment: rare social  . Drug use: No  . Sexual activity: Yes  Other Topics Concern  . Not on file  Social History Narrative  . Not on file   Social Determinants of Health   Financial Resource Strain:   . Difficulty of Paying Living Expenses:   Food Insecurity:   . Worried About Charity fundraiser in the Last Year:   . Arboriculturist in the Last Year:   Transportation Needs:   . Lexicographer (Medical):   Marland Kitchen Lack of Transportation (Non-Medical):   Physical Activity:   . Days of Exercise per Week:   . Minutes of Exercise per Session:   Stress:   . Feeling of Stress :   Social Connections:   . Frequency of Communication with Friends and Family:   . Frequency of Social Gatherings with Friends and Family:   . Attends Religious Services:   . Active Member of Clubs or Organizations:   . Attends Archivist Meetings:   Marland Kitchen Marital Status:     Vitals:   10/21/19 0906  BP: 128/70  Pulse: 81  Resp: 12  Temp: 98.2 F (36.8 C)  SpO2: 97%   Body mass index is 27.57 kg/m.   Physical Exam Vitals and nursing note reviewed.  Constitutional:      General: She is not in acute distress.    Appearance: She is well-developed.  HENT:     Head: Normocephalic and atraumatic.     Right Ear: Hearing, tympanic membrane, ear canal and external ear normal.     Left Ear: Hearing, tympanic membrane, ear canal and external ear normal.     Mouth/Throat:     Pharynx: Uvula midline.  Eyes:     Conjunctiva/sclera: Conjunctivae normal.     Pupils: Pupils are equal, round, and reactive to light.  Neck:     Thyroid: No thyromegaly.     Trachea: No tracheal deviation.  Cardiovascular:     Rate and Rhythm: Normal rate and regular rhythm.     Pulses:          Dorsalis pedis pulses are 2+ on the right side and 2+ on the left side.     Heart sounds: No murmur heard.   Pulmonary:     Effort: Pulmonary effort is normal. No respiratory distress.     Breath sounds: Normal breath sounds.  Abdominal:     Palpations: Abdomen is soft. There is no hepatomegaly or mass.     Tenderness: There is no abdominal tenderness.  Genitourinary:    Comments: Deferred to gyn. Musculoskeletal:     Comments: No signs of synovitis appreciated.  Lymphadenopathy:     Cervical: No cervical adenopathy.     Upper Body:     Right upper body: No supraclavicular adenopathy.     Left upper body:  No supraclavicular adenopathy.  Skin:    General: Skin is warm.     Findings: No erythema or rash.  Neurological:     Mental Status: She is alert and oriented to person, place, and time.     Cranial Nerves: No cranial nerve deficit.     Coordination: Coordination normal.     Gait: Gait normal.  Deep Tendon Reflexes:     Reflex Scores:      Bicep reflexes are 2+ on the right side and 2+ on the left side.      Patellar reflexes are 2+ on the right side and 2+ on the left side. Psychiatric:        Speech: Speech normal.     Comments: Well groomed, good eye contact.    ASSESSMENT AND PLAN:  [name] was seen today for establish care and annual exam.  Diagnoses and all orders for this visit:  Lab Results  Component Value Date   CREATININE 0.75 10/21/2019   BUN 9 10/21/2019   NA 138 10/21/2019   K 4.2 10/21/2019   CL 102 10/21/2019   CO2 25 10/21/2019   Lab Results  Component Value Date   HGBA1C 5.3 10/21/2019   Lab Results  Component Value Date   CHOL 212 (H) 10/21/2019   HDL 60 10/21/2019   LDLCALC 131 (H) 10/21/2019   TRIG 107 10/21/2019   CHOLHDL 3.5 10/21/2019    Routine general medical examination at a health care facility We discussed the importance of regular physical activity and healthy diet for prevention of chronic illness and/or complications. Preventive guidelines reviewed. Vaccination up to date.  Next CPE in a year.  The 10-year ASCVD risk score Mikey Bussing DC Brooke Bonito., et al., 2013) is: 1.1%   Values used to calculate the score:     Age: 47 years     Sex: Female     Is Non-Hispanic African American: No     Diabetic: No     Tobacco smoker: No     Systolic Blood Pressure: 902 mmHg     Is BP treated: No     HDL Cholesterol: 60 mg/dL     Total Cholesterol: 212 mg/dL  Colon cancer screening -     Ambulatory referral to Gastroenterology  Screening for lipoid disorders -     Lipid panel  Screening for endocrine, metabolic and immunity disorder -      Hemoglobin A1c -     Basic metabolic panel  Occupational exposure to risk factor -     Rabies Virus Antibody Titer  Encounter for HCV screening test for low risk patient -     Hepatitis C antibody   Return in 1 year (on 10/20/2020) for CPE.   Nyx Keady G. Martinique, MD  [facility]. [place] office.  Today you have you routine preventive visit. A few things to remember from today's visit:   Routine general medical examination at a health care facility  Colon cancer screening - Plan: Ambulatory referral to Gastroenterology  Screening for lipoid disorders - Plan: Lipid panel  Screening for endocrine, metabolic and immunity disorder - Plan: Basic metabolic panel, Hemoglobin A1c  Occupational exposure to risk factor - Plan: Rabies Virus Antibody Titer  Encounter for HCV screening test for low risk patient - Plan: Hepatitis C antibody  If you need refills please call your pharmacy. Do not use My Chart to request refills or for acute issues that need immediate attention.  Please be sure medication list is accurate. If a new problem present, please set up appointment sooner than planned today.  At least 150 minutes of moderate exercise per week, daily brisk walking for 15-30 min is a good exercise option. Healthy diet low in saturated (animal) fats and sweets and consisting of fresh fruits and vegetables, lean meats such as fish and white chicken and whole grains.  These are  some of recommendations for screening depending of age and risk factors:  - Vaccines:  Tdap vaccine every 10 years.  Shingles vaccine recommended at age 31, could be given after 50 years of age but not sure about insurance coverage.   Pneumonia vaccines: Pneumovax at 30. Sometimes Pneumovax is giving earlier if history of smoking, lung disease,diabetes,kidney disease among some.  Screening for diabetes at age 23 and every 3 years.  Cervical cancer prevention:  Pap smear starts at 50 years of age  and continues periodically until 50 years old in low risk women. Pap smear every 3 years between 66 and 27 years old. Pap smear every 3-5 years between women 44 and older if pap smear negative and HPV screening negative.   -Breast cancer: Mammogram: There is disagreement between experts about when to start screening in low risk asymptomatic female but recent recommendations are to start screening at 85 and not later than 50 years old , every 1-2 years and after 50 yo q 2 years. Screening is recommended until 50 years old but some women can continue screening depending of healthy issues.  Colon cancer screening: Has been recently changed to 50 yo. Insurance may not cover until you are 50 years old. Screening is recommended until 50 years old.  Cholesterol disorder screening at age 70 and every 3 years.  Also recommended:  1. Dental visit- Brush and floss your teeth twice daily; visit your dentist twice a year. 2. Eye doctor- Get an eye exam at least every 2 years. 3. Helmet use- Always wear a helmet when riding a bicycle, motorcycle, rollerblading or skateboarding. 4. Safe sex- If you may be exposed to sexually transmitted infections, use a condom. 5. Seat belts- Seat belts can save your live; always wear one. 6. Smoke/Carbon Monoxide detectors- These detectors need to be installed on the appropriate level of your home. Replace batteries at least once a year. 7. Skin cancer- When out in the sun please cover up and use sunscreen 15 SPF or higher. 8. Violence- If anyone is threatening or hurting you, please tell your healthcare provider.  9. Drink alcohol in moderation- Limit alcohol intake to one drink or less per day. Never drink and drive. 10. Calcium supplementation 1000 to 1200 mg daily, ideally through your diet.  Vitamin D supplementation 800 units daily.

## 2019-10-21 NOTE — Patient Instructions (Signed)
Today you have you routine preventive visit. A few things to remember from today's visit:   Routine general medical examination at a health care facility  Colon cancer screening - Plan: Ambulatory referral to Gastroenterology  Screening for lipoid disorders - Plan: Lipid panel  Screening for endocrine, metabolic and immunity disorder - Plan: Basic metabolic panel, Hemoglobin A1c  Occupational exposure to risk factor - Plan: Rabies Virus Antibody Titer  Encounter for HCV screening test for low risk patient - Plan: Hepatitis C antibody  If you need refills please call your pharmacy. Do not use My Chart to request refills or for acute issues that need immediate attention.  Please be sure medication list is accurate. If a new problem present, please set up appointment sooner than planned today.  At least 150 minutes of moderate exercise per week, daily brisk walking for 15-30 min is a good exercise option. Healthy diet low in saturated (animal) fats and sweets and consisting of fresh fruits and vegetables, lean meats such as fish and white chicken and whole grains.  These are some of recommendations for screening depending of age and risk factors:  - Vaccines:  Tdap vaccine every 10 years.  Shingles vaccine recommended at age 52, could be given after 50 years of age but not sure about insurance coverage.   Pneumonia vaccines: Pneumovax at 73. Sometimes Pneumovax is giving earlier if history of smoking, lung disease,diabetes,kidney disease among some.  Screening for diabetes at age 90 and every 3 years.  Cervical cancer prevention:  Pap smear starts at 50 years of age and continues periodically until 50 years old in low risk women. Pap smear every 3 years between 61 and 2 years old. Pap smear every 3-5 years between women 30 and older if pap smear negative and HPV screening negative.   -Breast cancer: Mammogram: There is disagreement between experts about when to start  screening in low risk asymptomatic female but recent recommendations are to start screening at 70 and not later than 50 years old , every 1-2 years and after 50 yo q 2 years. Screening is recommended until 50 years old but some women can continue screening depending of healthy issues.  Colon cancer screening: Has been recently changed to 50 yo. Insurance may not cover until you are 50 years old. Screening is recommended until 50 years old.  Cholesterol disorder screening at age 69 and every 3 years.  Also recommended:  1. Dental visit- Brush and floss your teeth twice daily; visit your dentist twice a year. 2. Eye doctor- Get an eye exam at least every 2 years. 3. Helmet use- Always wear a helmet when riding a bicycle, motorcycle, rollerblading or skateboarding. 4. Safe sex- If you may be exposed to sexually transmitted infections, use a condom. 5. Seat belts- Seat belts can save your live; always wear one. 6. Smoke/Carbon Monoxide detectors- These detectors need to be installed on the appropriate level of your home. Replace batteries at least once a year. 7. Skin cancer- When out in the sun please cover up and use sunscreen 15 SPF or higher. 8. Violence- If anyone is threatening or hurting you, please tell your healthcare provider.  9. Drink alcohol in moderation- Limit alcohol intake to one drink or less per day. Never drink and drive. 10. Calcium supplementation 1000 to 1200 mg daily, ideally through your diet.  Vitamin D supplementation 800 units daily.

## 2019-10-23 ENCOUNTER — Encounter: Payer: Self-pay | Admitting: Family Medicine

## 2019-11-11 LAB — HEPATITIS C ANTIBODY
Hepatitis C Ab: NONREACTIVE
SIGNAL TO CUT-OFF: 0.01 (ref <1.00)

## 2019-11-11 LAB — LIPID PANEL
Cholesterol: 212 mg/dL — ABNORMAL HIGH (ref <200)
HDL: 60 mg/dL (ref >=50)
LDL Cholesterol (Calc): 131 mg/dL (calc) — ABNORMAL HIGH
Non-HDL Cholesterol (Calc): 152 mg/dL (calc) — ABNORMAL HIGH (ref <130)
Total CHOL/HDL Ratio: 3.5 (calc) (ref <5.0)
Triglycerides: 107 mg/dL (ref <150)

## 2019-11-11 LAB — HEMOGLOBIN A1C
Hgb A1c MFr Bld: 5.3 % of total Hgb (ref <5.7)
Mean Plasma Glucose: 105 (calc)
eAG (mmol/L): 5.8 (calc)

## 2019-11-11 LAB — BASIC METABOLIC PANEL
BUN: 9 mg/dL (ref 7–25)
CO2: 25 mmol/L (ref 20–32)
Calcium: 9.5 mg/dL (ref 8.6–10.2)
Chloride: 102 mmol/L (ref 98–110)
Creat: 0.75 mg/dL (ref 0.50–1.10)
Glucose, Bld: 89 mg/dL (ref 65–99)
Potassium: 4.2 mmol/L (ref 3.5–5.3)
Sodium: 138 mmol/L (ref 135–146)

## 2019-11-11 LAB — RABIES VIRUS ANTIBODY TITER: Rabies Response: 12.1 IU/mL

## 2020-10-31 NOTE — Progress Notes (Signed)
Chief Complaint  Patient presents with   tick bites    Several in the last month, multiple in the past week. Starting to have knee pain on the left leg, which is where she was bit and the bullseye appeared.    HPI:  [name] is a 50 y.o. female, who is here today with above complaint. She is concerned about tick related disease. 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 Her dogs also stay outdoors,wonders if she is getting ticks from them. Not specifics about ticks features given.  Since 08/2020 she has found several ticks on different areas of her body, "tiny", "dot" size, baby ticks. They were attached,not engorged. She is not sure for how long ticks have been on at the time she found them. Some areas have ecchymosis associated with pruritic rash.  She has noted some ecchymosis on different areas with no hx of trauma, associated soreness. Negative for gun/nose bleed,blood in stool,melena,or gross hematuria. She has lost wt through a healthful diet. Negative for night sweats.  Negative for fever,oral lesions,numbness,tingling, or headaches.  + Arthralgias: For a years she has had intermittent right knee and ankle pain.  Knee pain seems to be worse for the past week or so. Right shoulder pain. Left knee pain is a new problem.Started after she sat on the floor for about 10 min performing dog's exam. This is not an unusual activity.   Negative for edema or erythema. Arthralgias exacerbated by long driving, hip pain with activity and rest, she also has joint pain while she is in bed. 6 days of upper and neck pain, feeling tight. She took Ibuprofen,pain has improved. Not radiated, no limitations of ROM. Fatigue, sleeping well, 6-7 hours.   She started taking Doxycycline.  Also would like to review labs done last visit, rabies Ab titers. She is fasting today, would like blood work done.  HLD:She is on non pharmacologic treatment. Component     Latest Ref Rng &  Units 10/21/2019  Cholesterol     0 - 200 mg/dL 212 (H)  HDL Cholesterol     >39.00 mg/dL 60  Triglycerides     0.0 - 149.0 mg/dL 107  LDL Cholesterol (Calc)     mg/dL (calc) 131 (H)  Total CHOL/HDL Ratio      3.5  Non-HDL Cholesterol (Calc)     <130 mg/dL (calc) 152 (H)   Review of Systems  Constitutional:  Positive for chills and fatigue. Negative for activity change and appetite change.  HENT:  Negative for nosebleeds and sore throat.   Eyes:  Negative for redness and visual disturbance.  Respiratory:  Negative for cough, shortness of breath and wheezing.   Cardiovascular:  Negative for chest pain, palpitations and leg swelling.  Gastrointestinal:  Negative for abdominal pain, nausea and vomiting.       Negative for changes in bowel habits.  Endocrine: Negative for cold intolerance, heat intolerance, polydipsia, polyphagia and polyuria.  Genitourinary:  Negative for decreased urine volume, dysuria and hematuria.  Musculoskeletal:  Positive for arthralgias and neck pain. Negative for gait problem.  Skin:  Positive for rash. Negative for wound.  Neurological:  Negative for syncope, facial asymmetry and weakness.  Hematological:  Negative for adenopathy.  Psychiatric/Behavioral:  Negative for confusion and sleep disturbance. The patient is nervous/anxious.    No current outpatient medications on file prior to visit.   No current facility-administered medications on file prior to visit.   Past  Medical History:  Diagnosis Date   Cancer [facility])    dx. cervical cancer s/p 12'14 cone biopsy   Family history of breast cancer    Family history of colon cancer    Family history of prostate cancer    Stomach problems    "stomach sensitive freq"   Allergies  Allergen Reactions   Augmentin [Amoxicillin-Pot Clavulanate] Nausea And Vomiting    Can take Keflex   Sulfa Antibiotics Nausea And Vomiting    Vomiting    Social History   Socioeconomic History   Marital status: Married     Spouse name: Not on file   Number of children: Not on file   Years of education: Not on file   Highest education level: Not on file  Occupational History   Not on file  Tobacco Use   Smoking status: Never   Smokeless tobacco: Never  Vaping Use   Vaping Use: Never used  Substance and Sexual Activity   Alcohol use: Yes    Comment: rare social   Drug use: No   Sexual activity: Yes  Other Topics Concern   Not on file  Social History Narrative   Not on file   Social Determinants of Health   Financial Resource Strain: Not on file  Food Insecurity: Not on file  Transportation Needs: Not on file  Physical Activity: Not on file  Stress: Not on file  Social Connections: Not on file   Vitals:   11/02/20 0834  BP: 120/70  Pulse: 97  Resp: 16  Temp: 98.2 F (36.8 C)  SpO2: 98%   Body mass index is 23.38 kg/m.  Physical Exam  Nursing note and vitals reviewed. Constitutional: She is oriented to person, place, and time. She appears well-developed. No distress.  HENT:  Head: Normocephalic and atraumatic.  Mouth/Throat: Oropharynx is clear and moist and mucous membranes are normal.  Eyes: Pupils are equal, round, and reactive to light. Conjunctivae are normal.  Cardiovascular: Normal rate and regular rhythm.  No murmur heard. Respiratory: Effort normal and breath sounds normal. No respiratory distress.  GI: Soft. She exhibits no mass. There is no hepatomegaly. There is no abdominal tenderness.  Musculoskeletal:        General: No edema.  No tenderness upon palpation of cervical and thoracic paraspinal muscles. No signs of synovitis. Lymphadenopathy:    She has no cervical adenopathy.  Neurological: She is alert and oriented to person, place, and time. She has normal strength. No cranial nerve deficit. Gait normal.  Skin: Skin is warm. Erythematous macular rash on areas of tick bites (posterior aspect of right shoulder and left groin.No local heat. Ecchymosis on anterior  right shoulder and around groin bite. Psychiatric: She has a normal mood and affect.  Well groomed, good eye contact.   ASSESSMENT AND PLAN:  [name] was seen today for tick bites.  Diagnoses and all orders for this visit: Orders Placed This Encounter  Procedures   B. burgdorfi Antibody   CBC with Differential/Platelet   Comprehensive metabolic panel   Hemoglobin A1c   Lipid panel   C-reactive protein   Sedimentation rate   TSH   Lab Results  Component Value Date   CREATININE 0.76 11/02/2020   BUN 17 11/02/2020   NA 140 11/02/2020   K 4.4 11/02/2020   CL 102 11/02/2020   CO2 29 11/02/2020   Lab Results  Component Value Date   ALT 11 11/02/2020   AST 16 11/02/2020   ALKPHOS 73 11/02/2020  BILITOT 0.5 11/02/2020   Lab Results  Component Value Date   WBC 4.0 11/02/2020   HGB 13.2 11/02/2020   HCT 39.2 11/02/2020   MCV 92.0 11/02/2020   PLT 196.0 11/02/2020   Lab Results  Component Value Date   CHOL 203 (H) 11/02/2020   HDL 74.30 11/02/2020   LDLCALC 108 (H) 11/02/2020   TRIG 104.0 11/02/2020   CHOLHDL 3 11/02/2020   Lab Results  Component Value Date   ESRSEDRATE 11 11/02/2020   Lab Results  Component Value Date   CRP <1.0 11/02/2020   Lab Results  Component Value Date   TSH 1.34 11/02/2020   Lab Results  Component Value Date   HGBA1C 5.8 11/02/2020   Fatigue, unspecified type We discussed possible etiologies: Systemic illness, immunologic,endocrinology,sleep disorder, psychiatric/psychologic, infectious,medications side effects, and idiopathic. Examination today does not suggest a serious process. Further recommendations will be given according to lab results.  Polyarthralgia Possible causes discussed. ? OA. No findings that suggest inflammatory process. Further recommendations according to lab results.  Tick bite, unspecified site, initial encounter She is on Doxycycline 100 mg bid, recommend completing 7 days. Further recommendations  according to lab results.  Bruising Hx does not suggest a serious process.Most likely related to minor trauma. Instructed about warning signs.  Hyperlipidemia, unspecified hyperlipidemia type Non pharmacologic treatment recommended for now. Further recommendations will be given according to 10 years CVD risk score and lipid panel numbers.  Diabetes mellitus screening -     Hemoglobin A1c  I spent a total of 43 minutes in both face to face and non face to face activities for this visit on the date of this encounter. During this time history was obtained and documented, examination was performed, prior labs reviewed, and assessment/plan discussed. Rabies virus titer result discussed, immunity is appropriate.  Return if symptoms worsen or fail to improve.  Julion Gatt G. [place], MD  [facility]. [place] office.

## 2020-11-02 ENCOUNTER — Other Ambulatory Visit: Payer: Self-pay

## 2020-11-02 ENCOUNTER — Encounter: Payer: Self-pay | Admitting: Family Medicine

## 2020-11-02 ENCOUNTER — Ambulatory Visit (INDEPENDENT_AMBULATORY_CARE_PROVIDER_SITE_OTHER): Payer: BC Managed Care – PPO | Admitting: Family Medicine

## 2020-11-02 VITALS — BP 120/70 | HR 97 | Temp 98.2°F | Resp 16 | Ht 63.75 in | Wt 135.1 lb

## 2020-11-02 DIAGNOSIS — M2550 Pain in unspecified joint: Secondary | ICD-10-CM

## 2020-11-02 DIAGNOSIS — W57XXXA Bitten or stung by nonvenomous insect and other nonvenomous arthropods, initial encounter: Secondary | ICD-10-CM

## 2020-11-02 DIAGNOSIS — E785 Hyperlipidemia, unspecified: Secondary | ICD-10-CM

## 2020-11-02 DIAGNOSIS — R5383 Other fatigue: Secondary | ICD-10-CM

## 2020-11-02 DIAGNOSIS — Z131 Encounter for screening for diabetes mellitus: Secondary | ICD-10-CM | POA: Diagnosis not present

## 2020-11-02 DIAGNOSIS — T148XXA Other injury of unspecified body region, initial encounter: Secondary | ICD-10-CM

## 2020-11-02 LAB — COMPREHENSIVE METABOLIC PANEL
ALT: 11 U/L (ref 0–35)
AST: 16 U/L (ref 0–37)
Albumin: 4.6 g/dL (ref 3.5–5.2)
Alkaline Phosphatase: 73 U/L (ref 39–117)
BUN: 17 mg/dL (ref 6–23)
CO2: 29 mEq/L (ref 19–32)
Calcium: 9.9 mg/dL (ref 8.4–10.5)
Chloride: 102 mEq/L (ref 96–112)
Creatinine, Ser: 0.76 mg/dL (ref 0.40–1.20)
GFR: 91.12 mL/min (ref >60.00)
Glucose, Bld: 80 mg/dL (ref 70–99)
Potassium: 4.4 mEq/L (ref 3.5–5.1)
Sodium: 140 mEq/L (ref 135–145)
Total Bilirubin: 0.5 mg/dL (ref 0.2–1.2)
Total Protein: 7.4 g/dL (ref 6.0–8.3)

## 2020-11-02 LAB — TSH: TSH: 1.34 u[IU]/mL (ref 0.35–5.50)

## 2020-11-02 LAB — C-REACTIVE PROTEIN: CRP: <1 mg/dL (ref 0.5–20.0)

## 2020-11-02 LAB — LIPID PANEL
Cholesterol: 203 mg/dL — ABNORMAL HIGH (ref 0–200)
HDL: 74.3 mg/dL (ref >39.00)
LDL Cholesterol: 108 mg/dL — ABNORMAL HIGH (ref 0–99)
NonHDL: 128.72
Total CHOL/HDL Ratio: 3
Triglycerides: 104 mg/dL (ref 0.0–149.0)
VLDL: 20.8 mg/dL (ref 0.0–40.0)

## 2020-11-02 LAB — HEMOGLOBIN A1C: Hgb A1c MFr Bld: 5.8 % (ref 4.6–6.5)

## 2020-11-02 LAB — CBC WITH DIFFERENTIAL/PLATELET
Basophils Absolute: 0 10*3/uL (ref 0.0–0.1)
Basophils Relative: 0.8 % (ref 0.0–3.0)
Eosinophils Absolute: 0.1 10*3/uL (ref 0.0–0.7)
Eosinophils Relative: 1.3 % (ref 0.0–5.0)
HCT: 39.2 % (ref 36.0–46.0)
Hemoglobin: 13.2 g/dL (ref 12.0–15.0)
Lymphocytes Relative: 35.5 % (ref 12.0–46.0)
Lymphs Abs: 1.4 10*3/uL (ref 0.7–4.0)
MCHC: 33.7 g/dL (ref 30.0–36.0)
MCV: 92 fl (ref 78.0–100.0)
Monocytes Absolute: 0.3 10*3/uL (ref 0.1–1.0)
Monocytes Relative: 8.4 % (ref 3.0–12.0)
Neutro Abs: 2.2 10*3/uL (ref 1.4–7.7)
Neutrophils Relative %: 54 % (ref 43.0–77.0)
Platelets: 196 10*3/uL (ref 150.0–400.0)
RBC: 4.26 Mil/uL (ref 3.87–5.11)
RDW: 13.1 % (ref 11.5–15.5)
WBC: 4 10*3/uL (ref 4.0–10.5)

## 2020-11-02 LAB — SEDIMENTATION RATE: Sed Rate: 11 mm/hr (ref 0–30)

## 2020-11-02 NOTE — Patient Instructions (Signed)
A few things to remember from today's visit:   Fatigue, unspecified type - Plan: CBC with Differential/Platelet, Comprehensive metabolic panel, C-reactive protein, Sedimentation rate, TSH  Arthralgia, unspecified joint  Tick bite, unspecified site, initial encounter - Plan: B. burgdorfi Antibody  Bruising  Hyperlipidemia, unspecified hyperlipidemia type - Plan: Lipid panel  Diabetes mellitus screening - Plan: Hemoglobin A1c  Monitor for new symptoms. Complete 7 days of doxycycline.  If you need refills please call your pharmacy. Do not use My Chart to request refills or for acute issues that need immediate attention.    Please be sure medication list is accurate. If a new problem present, please set up appointment sooner than planned today.

## 2020-11-05 LAB — B. BURGDORFI ANTIBODIES: B burgdorferi Ab IgG+IgM: <0.9 index

## 2020-11-06 ENCOUNTER — Encounter: Payer: Self-pay | Admitting: Family Medicine

## 2021-03-06 ENCOUNTER — Other Ambulatory Visit: Payer: Self-pay | Admitting: Gynecologic Oncology

## 2021-03-06 ENCOUNTER — Telehealth: Payer: Self-pay | Admitting: *Deleted

## 2021-03-06 DIAGNOSIS — Z1231 Encounter for screening mammogram for malignant neoplasm of breast: Secondary | ICD-10-CM

## 2021-03-06 DIAGNOSIS — Z803 Family history of malignant neoplasm of breast: Secondary | ICD-10-CM

## 2021-03-06 DIAGNOSIS — M25512 Pain in left shoulder: Secondary | ICD-10-CM | POA: Diagnosis not present

## 2021-03-06 NOTE — Telephone Encounter (Signed)
Returned the patient's call and scheduled a follow up appt for 12/22 at 1 pm with Melissa APP. Explained that the order for the mammogram will be placed

## 2021-03-13 DIAGNOSIS — M7542 Impingement syndrome of left shoulder: Secondary | ICD-10-CM | POA: Diagnosis not present

## 2021-03-13 DIAGNOSIS — M25512 Pain in left shoulder: Secondary | ICD-10-CM | POA: Diagnosis not present

## 2021-03-13 DIAGNOSIS — M6281 Muscle weakness (generalized): Secondary | ICD-10-CM | POA: Diagnosis not present

## 2021-03-14 ENCOUNTER — Inpatient Hospital Stay: Payer: BC Managed Care – PPO | Attending: Gynecologic Oncology | Admitting: Gynecologic Oncology

## 2021-03-14 ENCOUNTER — Other Ambulatory Visit: Payer: Self-pay

## 2021-03-14 ENCOUNTER — Other Ambulatory Visit (HOSPITAL_COMMUNITY)
Admission: RE | Admit: 2021-03-14 | Discharge: 2021-03-14 | Disposition: A | Discharge status: Home / Self Care | Payer: BC Managed Care – PPO | Source: Ambulatory Visit | Attending: Gynecologic Oncology | Admitting: Gynecologic Oncology

## 2021-03-14 ENCOUNTER — Telehealth: Payer: Self-pay | Admitting: *Deleted

## 2021-03-14 ENCOUNTER — Encounter: Payer: Self-pay | Admitting: Gynecologic Oncology

## 2021-03-14 VITALS — BP 136/79 | HR 77 | Temp 98.5°F | Resp 18 | Ht 63.78 in | Wt 135.0 lb

## 2021-03-14 DIAGNOSIS — Z90722 Acquired absence of ovaries, bilateral: Secondary | ICD-10-CM | POA: Diagnosis not present

## 2021-03-14 DIAGNOSIS — Z90710 Acquired absence of both cervix and uterus: Secondary | ICD-10-CM | POA: Insufficient documentation

## 2021-03-14 DIAGNOSIS — C530 Malignant neoplasm of endocervix: Secondary | ICD-10-CM | POA: Insufficient documentation

## 2021-03-14 DIAGNOSIS — Z8541 Personal history of malignant neoplasm of cervix uteri: Secondary | ICD-10-CM | POA: Diagnosis not present

## 2021-03-14 DIAGNOSIS — Z1211 Encounter for screening for malignant neoplasm of colon: Secondary | ICD-10-CM

## 2021-03-14 NOTE — Patient Instructions (Addendum)
Good seeing you today. Your exam today was normal. We will contact you with the results of your pap smear from today along with HPV testing. We will also place a referral for you to meet with a gastroenterologist to schedule a colonoscopy. Plan to follow up in one year or sooner if needed. Please call [phone] in Nov or early Dec 2023 to schedule.   I would recommend buying some compression socks (can be found on amaz[employer]) and wear these during the day to see if this helps with your leg symptoms.   Symptoms to report to your health care team include vaginal bleeding, rectal bleeding, bloating, weight loss without effort, new and persistent pain, new and  persistent fatigue, new leg swelling, new masses (i.e., bumps in your neck or groin), new and persistent cough, new and persistent nausea and vomiting, change in bowel or bladder habits, and any other concerns.

## 2021-03-14 NOTE — Telephone Encounter (Signed)
Per patient request attempted to schedule a mammogram on 12/28 to 12/29 between 12:30 pm and 2 pm. No appointments available. Called and left the patient the message of no appointments, but that she can either schedule appointment online or call the breast center.

## 2021-03-14 NOTE — Progress Notes (Signed)
Follow Up Note: Gyn-Onc  Stephanie Johnson 50 y.o. female  CC:  Cervical cancer follow up  HPI: Stephanie Johnson is a 50 year old female,gravida 6 aborta 3, initially seen in consultation at the request of Dr. Willis Modena. She had a pap smear that was normal in 2012 but she did have high risk HPV. Per Dr. Alycia Rossetti: She went to see her gynecologist for her annual Pap smear in September 2014, with her Pap smear revealing CIN-3 with severe dysplasia. She underwent a LEEP on January 18, 2013, which revealed CIN-3 with a positive margin. They discussed proceeding with a cold knife conization for definitive treatment and diagnosis. She underwent a cold knife conization on March 08, 2013 that revealed:    Diagnosis, Cervix, cone:  - INVASIVE ENDOCERVICAL TYPE ADENOCARCINOMA ARISING IN A BACKGROUND OF ENDOCERVICAL ADENOCARCINOMA IN SITU, SEE COMMENT.  - HIGH GRADE SQUAMOUS INTRAEPITHELIAL LESION, CIN-III (SEVERE DYSPLASIA/CIS) WITH ENDOCERVICAL GLANDULAR EXTENSION  - IN SITU ADENOCARCINOMA PRESENT AT ENDOCERVICAL MARGIN.  - ADENOCARCINOMA IS LESS THAN 1MM FROM DEEP EXCISIONAL MARGIN.    The specimen was submitted to Dr. Annamaria Boots at [facility] to affirm the original diagnosis.  Per Dr. Annamaria Boots, there is indeed endocervical adenocarcinoma, grade 2 out of 3.  Given the presence of tumor at excision margins, the tumor is at least 7 mm wide and has a depth of at least 4 mm. No lymph vascular invasion is identified and severe squamous dysplasia is present.   On 04/19/13, she underwent a total robotic radical hysterectomy, bilateral salpingectomies, bilateral pelvic lymph node dissection, bilateral oophoropexy.  Her post-operative course was uneventful.  Final pathology revealed: 1. Fallopian tube, right - UNREMARKABLE FALLOPIAN TUBE. NO ENDOMETRIOSIS OR MALIGNANCY. 2. Fallopian tube, left - UNREMARKABLE FALLOPIAN TUBE. NO ENDOMETRIOSIS OR MALIGNANCY. 3. Uterus +/- tubes/ovaries, neoplastic, with cervix - CERVIX:  SQUAMOUS METAPLASIA AND ENDOCERVICAL GLANDULAR TUBAL METAPLASIA ASSOCIATED WITH INFLAMMATION AND FIBROSIS. NO RESIDUAL MALIGNANCY IDENTIFIED. - ENDOMETRIUM AND MYOMETRIUM: BENIGN WITH NO EVIDENCE OF MALIGNANCY. 4. Vagina, biopsy, posterior margin - BENIGN SQUAMOUS MUCOSA. NO EVIDENCE OF MALIGNANCY. 5. Lymph nodes, regional resection, right pelvic - THREE BENIGN LYMPH NODES (0/3). 6. Lymph nodes, regional resection, left pelvic - THREE BENIGN LYMPH NODES (0/3).   Of note, she is BRCA negative.    Interval History: She presents today for continued surveillance.  She states she has been doing well.  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  Her family is doing well and her daughter is in college at [employer]. She has lost weight on a diet plan consisting of shakes and protein bars and uses this as meal replacement when busy at work. She would like a referral to GI for colonoscopy. She denies changes in her bowels and states she had two episodes of rectal bleeding noted when wiping after having diarrhea or an upset stomach. She denies changes in bladder habits, vaginal bleeding or discharge. No abdominal pain or tenderness reported. She reports joint soreness and leg aches at night after working all day. No other concerns voiced. No concerning symptoms voiced.   Review of Systems: Constitutional: Feels well. No fever, chills. No early satiety, change in appetite. Intentional weight loss.  Cardiovascular: No chest pain, shortness of breath, or edema.  Pulmonary: No cough or wheeze.  Gastrointestinal: Two episodes of rectal bleeding when wiping. No nausea, vomiting, or diarrhea. No change in bowel movement.  Genitourinary: No frequency, urgency, or dysuria. No vaginal bleeding or discharge.  Musculoskeletal: Joint pain at the end  of the day. Neurologic: No weakness, numbness, or change in gait.  Psychology: Positive for insomnia related to her 20 yr old dog. No depression, anxiety. Breasts: No breast  masses noted, no nipple discharge  Health Maintenance: Mammogram: Had 3D 03/05/2018, order has been placed Pap Smear: 03/04/2017 GI referral placed  Current Meds:  No outpatient encounter medications on file as of 03/14/2021.   No facility-administered encounter medications on file as of 03/14/2021.    Allergy:  Allergies  Allergen Reactions   Augmentin [Amoxicillin-Pot Clavulanate] Nausea And Vomiting    Can take Keflex   Sulfa Antibiotics Nausea And Vomiting    Vomiting    Social Hx:   Social History   Socioeconomic History   Marital status: Married    Spouse name: Not on file   Number of children: Not on file   Years of education: Not on file   Highest education level: Not on file  Occupational History   Not on file  Tobacco Use   Smoking status: Never   Smokeless tobacco: Never  Vaping Use   Vaping Use: Never used  Substance and Sexual Activity   Alcohol use: Yes    Comment: rare social   Drug use: No   Sexual activity: Yes  Other Topics Concern   Not on file  Social History Narrative   Not on file   Social Determinants of Health   Financial Resource Strain: Not on file  Food Insecurity: Not on file  Transportation Needs: Not on file  Physical Activity: Not on file  Stress: Not on file  Social Connections: Not on file  Intimate Partner Violence: Not on file    Past Surgical Hx:  Past Surgical History:  Procedure Laterality Date   CERVICAL CONIZATION W/BX N/A 03/08/2013   Procedure: CONIZATION CERVIX WITH BIOPSY ;  Surgeon: Cheri Fowler, MD;  Location: [facility];  Service: Gynecology;  Laterality: N/A;  1hr OR time   childbirth     x 3 NVD   DILATION AND CURETTAGE OF UTERUS     ROBOTIC ASSISTED TOTAL HYSTERECTOMY WITH BILATERAL SALPINGO OOPHERECTOMY N/A 04/19/2013   Procedure: ROBOTIC ASSISTED RADICAL HYSTERECTOMY WITH BILATERAL SALPINGECTOMY AND PELVIC LYMPH NODE DISSECTION;  Surgeon: Imagene Gurney A. Alycia Rossetti, MD;  Location: [facility];  Service: Gynecology;   Laterality: N/A;    Past Medical Hx:  Past Medical History:  Diagnosis Date   Cancer ([place])    dx. cervical cancer s/p 12'14 cone biopsy   Family history of breast cancer    Family history of colon cancer    Family history of prostate cancer    Stomach problems    "stomach sensitive freq"    Family Hx:  Family History  Problem Relation Age of Onset   Breast cancer Mother 62       stage IV, had for 32 years ([facility]bil [facility])   Cancer Father        prostate   Hypertension Father    Stroke Father    Migraines Brother    Cancer Brother 40       prostate   Cancer Maternal Uncle        prostate cancer   Dementia Paternal Aunt    Cancer Maternal Grandmother 20       breast    Leukemia Maternal Grandfather    Cancer Paternal Grandmother        unknown   Heart failure Paternal Grandfather 40       congestive   Diabetes Daughter  6       DM I   Cancer Cousin 40       Maternal colon cancer   Endometrial cancer Neg Hx    Ovarian cancer Neg Hx     Vitals:  Blood pressure 136/79, pulse 77, temperature 98.5 F (36.9 C), temperature source Oral, resp. rate 18, height 5' 3.78" (1.62 m), weight 135 lb (61.2 kg), last menstrual period 03/24/2013, SpO2 97 %.  Physical Exam: General: Well developed, well nourished female in no acute distress. Alert and oriented x 3.  Lymph node survey: No cervical, supraclavicular, or inguinal adenopathy.  Cardiovascular: Regular rate and rhythm. S1 and S2 normal.  Lungs: Clear to auscultation bilaterally. No wheezes/crackles/rhonchi noted.  Skin: No rashes or lesions present. Breasts: Inspection negative with no nodularity, masses, erythema, or discharge noted bilaterally on exam. Abdomen: Abdomen soft, non-tender and non-obese. Active bowel sounds in all quadrants. No evidence of a fluid wave or abdominal masses. Lap sites well healed without nodularity or signs of herniation.  Genitourinary:    Vulva/vagina: Normal external female genitalia. No  lesions.    Urethra: No lesions or masses    Vagina: Mildly atrophic without any lesions. No palpable masses. No vaginal bleeding noted. Thinprep pap obtained.  Rectal: Good tone, no masses, no cul de sac nodularity, no obvious hemorrhoids/fissures noted externally.  Extremities: No bilateral cyanosis, edema, or clubbing.   Assessment/Plan: 50 year old female s/p total robotic radical hysterectomy, bilateral salpingectomies, bilateral pelvic lymph node dissection, bilateral oophoropexy on 04/19/2013 for a stage IB 1 adenocarcinoma cervix. She had no residual tumor in her cervix and negative lymph nodes.    No evidence of recurrence on today's exam. We will contact her with the results of her pap smear/HPV testing from today. A referral was placed to gastroenterology for screening colonoscopy. Mammogram order has also been placed. Our office will call to check with the [facility] about possible availability for next week. We discussed the use of compression socks or stockings to see if this helps with improvement in BLE symptoms. Her recent lab work was also reviewed. Reportable signs and symptoms reviewed. She is advised to follow up in one year or sooner if needed. She is advised to call for any questions or concerns.    Dorothyann Gibbs, NP 03/15/2021, 2:57 PM

## 2021-03-15 ENCOUNTER — Encounter: Payer: Self-pay | Admitting: Gynecologic Oncology

## 2021-03-21 ENCOUNTER — Telehealth: Payer: Self-pay

## 2021-03-21 LAB — CYTOLOGY - PAP
Comment: NEGATIVE
Diagnosis: NEGATIVE
High risk HPV: NEGATIVE

## 2021-03-21 NOTE — Telephone Encounter (Signed)
Spoke with Stephanie Johnson this afternoon regarding her pap smear results. Pt informed that the pap was normal and HPV negative. Pt verbalized understanding.

## 2021-04-17 ENCOUNTER — Ambulatory Visit
Admission: RE | Admit: 2021-04-17 | Discharge: 2021-04-17 | Disposition: A | Discharge status: Home / Self Care | Payer: BC Managed Care – PPO | Source: Ambulatory Visit | Attending: Gynecologic Oncology | Admitting: Gynecologic Oncology

## 2021-04-17 DIAGNOSIS — Z1231 Encounter for screening mammogram for malignant neoplasm of breast: Secondary | ICD-10-CM | POA: Diagnosis not present

## 2021-04-17 DIAGNOSIS — M25512 Pain in left shoulder: Secondary | ICD-10-CM | POA: Diagnosis not present

## 2021-04-17 DIAGNOSIS — Z803 Family history of malignant neoplasm of breast: Secondary | ICD-10-CM

## 2021-05-02 ENCOUNTER — Encounter: Payer: Self-pay | Admitting: Gynecologic Oncology

## 2021-08-14 ENCOUNTER — Telehealth: Payer: Self-pay

## 2021-08-14 NOTE — Telephone Encounter (Signed)
LVM instructions for pt to call office to schedule CPE. ?

## 2021-10-10 ENCOUNTER — Telehealth: Payer: Self-pay

## 2021-10-10 NOTE — Telephone Encounter (Signed)
LVM for pt to call back to reschedule appointment in December.  Change from 12/21  to 12/7

## 2021-10-15 NOTE — Telephone Encounter (Signed)
LVM for patient to call office regarding changing December appointment. I will send a Mychart message as well.

## 2022-02-19 ENCOUNTER — Telehealth: Payer: Self-pay | Admitting: *Deleted

## 2022-02-19 NOTE — Telephone Encounter (Signed)
Called and left the patient a message to call the office back. Patient needs to be reschedule from 12/21 with Melissa APP due to there being in the [place]

## 2022-02-21 NOTE — Telephone Encounter (Signed)
Called and left the patient a message to call the office back. Patient needs to be reschedule from 12/21 with Melissa APP due to there being in the [place]

## 2022-02-27 NOTE — Telephone Encounter (Signed)
Called and left the patient a message to call the office back. Patient needs to be reschedule from 12/21 with Melissa APP due to there being in the [place]

## 2022-02-28 NOTE — Telephone Encounter (Signed)
Spoke with the patient and rescheduled the patient from 12/21 to 12/13

## 2022-03-05 ENCOUNTER — Encounter: Payer: Self-pay | Admitting: Oncology

## 2022-03-05 ENCOUNTER — Other Ambulatory Visit: Payer: Self-pay

## 2022-03-05 ENCOUNTER — Other Ambulatory Visit (HOSPITAL_COMMUNITY)
Admission: RE | Admit: 2022-03-05 | Discharge: 2022-03-05 | Disposition: A | Discharge status: Home / Self Care | Payer: BC Managed Care – PPO | Source: Ambulatory Visit | Attending: Gynecologic Oncology | Admitting: Gynecologic Oncology

## 2022-03-05 ENCOUNTER — Inpatient Hospital Stay: Payer: BC Managed Care – PPO | Attending: Gynecologic Oncology | Admitting: Gynecologic Oncology

## 2022-03-05 ENCOUNTER — Inpatient Hospital Stay: Payer: BC Managed Care – PPO

## 2022-03-05 ENCOUNTER — Encounter: Payer: Self-pay | Admitting: Gynecologic Oncology

## 2022-03-05 VITALS — BP 116/66 | HR 92 | Temp 98.5°F | Resp 14 | Ht 64.0 in | Wt 149.6 lb

## 2022-03-05 DIAGNOSIS — R8290 Unspecified abnormal findings in urine: Secondary | ICD-10-CM

## 2022-03-05 DIAGNOSIS — Z8541 Personal history of malignant neoplasm of cervix uteri: Secondary | ICD-10-CM | POA: Diagnosis not present

## 2022-03-05 DIAGNOSIS — C530 Malignant neoplasm of endocervix: Secondary | ICD-10-CM | POA: Insufficient documentation

## 2022-03-05 DIAGNOSIS — R8271 Bacteriuria: Secondary | ICD-10-CM

## 2022-03-05 DIAGNOSIS — Z1231 Encounter for screening mammogram for malignant neoplasm of breast: Secondary | ICD-10-CM

## 2022-03-05 DIAGNOSIS — Z124 Encounter for screening for malignant neoplasm of cervix: Secondary | ICD-10-CM | POA: Diagnosis not present

## 2022-03-05 DIAGNOSIS — Z1211 Encounter for screening for malignant neoplasm of colon: Secondary | ICD-10-CM

## 2022-03-05 LAB — URINALYSIS, COMPLETE (UACMP) WITH MICROSCOPIC
Bilirubin Urine: NEGATIVE
Glucose, UA: NEGATIVE mg/dL
Hgb urine dipstick: NEGATIVE
Ketones, ur: 5 mg/dL — AB
Nitrite: POSITIVE — AB
Protein, ur: NEGATIVE mg/dL
Specific Gravity, Urine: 1.019 (ref 1.005–1.030)
pH: 6 (ref 5.0–8.0)

## 2022-03-05 NOTE — Patient Instructions (Addendum)
Happy Holidays to you!   We will call you with the results of your urinalysis from today. If abnormal, we will add on a culture.  We will also place orders for colonoscopy referral and mammogram.  Good seeing you today. Your exam today was normal. We will contact you with the results of your pap smear from today along with HPV testing.   Plan to follow up in one year or sooner if needed. Please call [phone] in Nov or early Dec 2024 to schedule.    Symptoms to report to your health care team include vaginal bleeding, rectal bleeding, bloating, weight loss without effort, new and persistent pain, new and  persistent fatigue, new leg swelling, new masses (i.e., bumps in your neck or groin), new and persistent cough, new and persistent nausea and vomiting, change in bowel or bladder habits, and any other concerns.

## 2022-03-05 NOTE — Progress Notes (Signed)
Follow Up Note: Gyn-Onc  [name] 51 y.o. female  CC:  Cervical cancer follow up  HPI: Stephanie Johnson is a 51 year old female,gravida 6 aborta 3, initially seen in consultation at the request of Dr. Willis Modena. She had a pap smear that was normal in 2012 but she did have high risk HPV. Per Dr. Alycia Rossetti: She went to see her gynecologist for her annual Pap smear in September 2014, with her Pap smear revealing CIN-3 with severe dysplasia. She underwent a LEEP on January 18, 2013, which revealed CIN-3 with a positive margin. They discussed proceeding with a cold knife conization for definitive treatment and diagnosis. She underwent a cold knife conization on March 08, 2013 that revealed:    Diagnosis, Cervix, cone:  - INVASIVE ENDOCERVICAL TYPE ADENOCARCINOMA ARISING IN A BACKGROUND OF ENDOCERVICAL ADENOCARCINOMA IN SITU, SEE COMMENT.  - HIGH GRADE SQUAMOUS INTRAEPITHELIAL LESION, CIN-III (SEVERE DYSPLASIA/CIS) WITH ENDOCERVICAL GLANDULAR EXTENSION  - IN SITU ADENOCARCINOMA PRESENT AT ENDOCERVICAL MARGIN.  - ADENOCARCINOMA IS LESS THAN 1MM FROM DEEP EXCISIONAL MARGIN.    The specimen was submitted to Dr. Annamaria Boots at [facility] to affirm the original diagnosis.  Per Dr. Annamaria Boots, there is indeed endocervical adenocarcinoma, grade 2 out of 3.  Given the presence of tumor at excision margins, the tumor is at least 7 mm wide and has a depth of at least 4 mm. No lymph vascular invasion is identified and severe squamous dysplasia is present.   On 04/19/13, she underwent a total robotic radical hysterectomy, bilateral salpingectomies, bilateral pelvic lymph node dissection, bilateral oophoropexy.  Her post-operative course was uneventful.  Final pathology revealed: 1. Fallopian tube, right - UNREMARKABLE FALLOPIAN TUBE. NO ENDOMETRIOSIS OR MALIGNANCY. 2. Fallopian tube, left - UNREMARKABLE FALLOPIAN TUBE. NO ENDOMETRIOSIS OR MALIGNANCY. 3. Uterus +/- tubes/ovaries, neoplastic, with cervix - CERVIX:  SQUAMOUS METAPLASIA AND ENDOCERVICAL GLANDULAR TUBAL METAPLASIA ASSOCIATED WITH INFLAMMATION AND FIBROSIS. NO RESIDUAL MALIGNANCY IDENTIFIED. - ENDOMETRIUM AND MYOMETRIUM: BENIGN WITH NO EVIDENCE OF MALIGNANCY. 4. Vagina, biopsy, posterior margin - BENIGN SQUAMOUS MUCOSA. NO EVIDENCE OF MALIGNANCY. 5. Lymph nodes, regional resection, right pelvic - THREE BENIGN LYMPH NODES (0/3). 6. Lymph nodes, regional resection, left pelvic - THREE BENIGN LYMPH NODES (0/3).   Of note, she is BRCA negative. She did have genetic testing with [facility] from 10/2018 that was negative.   Interval History: She presents today for continued surveillance. She states she has been doing well since her last visit and is staying busy.  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  Her family is doing well and her daughter is planning on studying abroad in [place] for a semester. She has plans to visit in March. Her weight has fluctuated and is related to dietary choices.   She denies changes in her bowels. She denies changes in bladder habits, vaginal bleeding or discharge. No abdominal pain or tenderness reported. She reports joint soreness and leg aches at night after working all day but no significant swelling. She reports having foul smelling urine. No other symptoms at this time but she states she has had UTIs in the past that have had little symptoms vs significant symptoms.   No other concerns voiced. No concerning symptoms voiced. She feels her schedule is more manageable and would like a referral to GI for colonoscopy. She would like to receive her genetic results from 10/2018 and would like to see if she needs updated genetic testing.  Review  of Systems: Constitutional: Feels well. No fever, chills. No early satiety, change in appetite. Weight has slightly increased and is related to oral intake/dietary choices.  Cardiovascular: No chest pain, shortness of breath, or edema.   Pulmonary: No cough or wheeze.  Gastrointestinal: No nausea, vomiting, or diarrhea. No change in bowel movement.  Genitourinary: No frequency, urgency, or dysuria. No vaginal bleeding or discharge.  Musculoskeletal: Joint pain at the end of the day. Neurologic: No weakness, numbness, or change in gait.   Health Maintenance: Mammogram: Had 3D 04/17/2021, order has been placed Pap Smear: 03/14/2021 GI referral placed for screening colonoscopy  Current Meds:  No outpatient encounter medications on file as of 03/05/2022.   No facility-administered encounter medications on file as of 03/05/2022.    Allergy:  Allergies  Allergen Reactions   Augmentin [Amoxicillin-Pot Clavulanate] Nausea And Vomiting    Can take Keflex   Sulfa Antibiotics Nausea And Vomiting    Vomiting    Social Hx:   Social History   Socioeconomic History   Marital status: Married    Spouse name: Not on file   Number of children: Not on file   Years of education: Not on file   Highest education level: Not on file  Occupational History   Not on file  Tobacco Use   Smoking status: Never   Smokeless tobacco: Never  Vaping Use   Vaping Use: Never used  Substance and Sexual Activity   Alcohol use: Yes    Comment: rare social   Drug use: No   Sexual activity: Yes  Other Topics Concern   Not on file  Social History Narrative   Not on file   Social Determinants of Health   Financial Resource Strain: Not on file  Food Insecurity: Not on file  Transportation Needs: Not on file  Physical Activity: Not on file  Stress: Not on file  Social Connections: Not on file  Intimate Partner Violence: Not on file    Past Surgical Hx:  Past Surgical History:  Procedure Laterality Date   CERVICAL CONIZATION W/BX N/A 03/08/2013   Procedure: CONIZATION CERVIX WITH BIOPSY ;  Surgeon: Cheri Fowler, MD;  Location: [facility];  Service: Gynecology;  Laterality: N/A;  1hr OR time   childbirth     x 3 NVD   DILATION  AND CURETTAGE OF UTERUS     ROBOTIC ASSISTED TOTAL HYSTERECTOMY WITH BILATERAL SALPINGO OOPHERECTOMY N/A 04/19/2013   Procedure: ROBOTIC ASSISTED RADICAL HYSTERECTOMY WITH BILATERAL SALPINGECTOMY AND PELVIC LYMPH NODE DISSECTION;  Surgeon: Imagene Gurney A. Alycia Rossetti, MD;  Location: [facility];  Service: Gynecology;  Laterality: N/A;    Past Medical Hx:  Past Medical History:  Diagnosis Date   Cancer ([place])    dx. cervical cancer s/p 12'14 cone biopsy   Carcinoma in situ of cervix 03/08/2013   Family history of breast cancer    Family history of colon cancer    Family history of prostate cancer    Stomach problems    "stomach sensitive freq"    Family Hx:  Family History  Problem Relation Age of Onset   Breast cancer Mother 55       stage IV, had for 53 years ([facility])   Cancer Father        prostate   Hypertension Father    Stroke Father    Migraines Brother    Cancer Brother 38       prostate   Cancer Maternal Uncle  prostate cancer   Dementia Paternal Aunt    Cancer Maternal Grandmother 41       breast    Leukemia Maternal Grandfather    Cancer Paternal Grandmother        unknown   Heart failure Paternal Grandfather 60       congestive   Diabetes Daughter 6       DM I   Cancer Cousin 53       Maternal colon cancer   Endometrial cancer Neg Hx    Ovarian cancer Neg Hx     Vitals:  Blood pressure 116/66, pulse 92, temperature 98.5 F (36.9 C), temperature source Oral, resp. rate 14, height 5' 4" (1.626 m), weight 149 lb 9.6 oz (67.9 kg), last menstrual period 03/24/2013, SpO2 98 %.  Physical Exam: General: Well developed, well nourished female in no acute distress. Alert and oriented x 3.  Lymph node survey: No inguinal adenopathy.  Cardiovascular: Regular rate and rhythm. S1 and S2 normal.  Lungs: Clear to auscultation bilaterally. No wheezes/crackles/rhonchi noted.  Skin: No rashes or lesions present. Abdomen: Abdomen soft, non-tender and non-obese. Active bowel  sounds in all quadrants. No evidence of a fluid wave or abdominal masses. Lap sites well healed without nodularity or signs of herniation.  Genitourinary:    Vulva/vagina: Normal external female genitalia. No lesions.    Urethra: No lesions or masses    Vagina: Mildly atrophic without any lesions. No palpable masses. No vaginal bleeding noted. Thinprep pap obtained.  Rectal: Good tone, no masses, no cul de sac nodularity, no obvious hemorrhoids/fissures noted externally.  Extremities: No bilateral cyanosis, edema, or clubbing.   Assessment/Plan: 51 year old female s/p total robotic radical hysterectomy, bilateral salpingectomies, bilateral pelvic lymph node dissection, bilateral oophoropexy on 04/19/2013 for a stage IB 1 adenocarcinoma cervix. She had no residual tumor in her cervix and negative lymph nodes.    No evidence of recurrence on today's exam. We will contact her with the results of her pap smear/HPV testing from today. A referral has been placed to gastroenterology for screening colonoscopy. Mammogram order has also been placed for Jan 2024. We will reach out to genetics to obtain the report from 10/2018 and to see if updated testing is needed. We will send her urine today for analysis and contact her with the results. Reportable signs and symptoms reviewed. She is advised to follow up in one year or sooner if needed. She is advised to call for any questions or concerns.    Dorothyann Gibbs, NP 03/05/2022, 4:20 PM

## 2022-03-06 NOTE — Progress Notes (Signed)
Emailed genetic testing results from 10/2018 to [facility] per her request and advised her that she does not need updated genetic testing per Roma Kayser, Counselor.

## 2022-03-07 ENCOUNTER — Telehealth: Payer: Self-pay

## 2022-03-07 LAB — URINE CULTURE: Culture: >=100000 — AB

## 2022-03-07 NOTE — Telephone Encounter (Signed)
LVM for patient to call office regarding recent urinalysis.   Per Stephanie John, NP. Culture shows infection (E-Coli) Need to see how pt is feeling and is the Cephalexin working? If not we can call in something stronger.

## 2022-03-10 NOTE — Telephone Encounter (Signed)
I left another message for patient to call office regarding urinalysis resutls

## 2022-03-11 NOTE — Telephone Encounter (Signed)
Unable to reach pt after several attempts regarding urinalysis collected last week.  Joylene John NP notified

## 2022-03-13 ENCOUNTER — Ambulatory Visit: Payer: BC Managed Care – PPO | Admitting: Gynecologic Oncology

## 2022-03-14 LAB — CYTOLOGY - PAP
Comment: NEGATIVE
Diagnosis: REACTIVE
High risk HPV: NEGATIVE

## 2022-03-18 ENCOUNTER — Telehealth: Payer: Self-pay

## 2022-03-18 NOTE — Telephone Encounter (Signed)
Per Joylene John NP, pt is aware of recent pap smear results being negative for pre-cancer/cancer and high risk HPV negative

## 2022-07-07 IMAGING — MG MM DIGITAL SCREENING BILAT W/ TOMO AND CAD
8 series · 9 of 24 positions shown · non-contrast
Comparison: Previous exam(s).

CLINICAL DATA: Screening.

EXAM:
DIGITAL SCREENING BILATERAL MAMMOGRAM WITH TOMOSYNTHESIS AND CAD
TECHNIQUE: Bilateral screening digital craniocaudal and mediolateral oblique
mammograms were obtained. Bilateral screening digital breast
tomosynthesis was performed. The images were evaluated with
computer-aided detection.

[L MLO synth-2D]
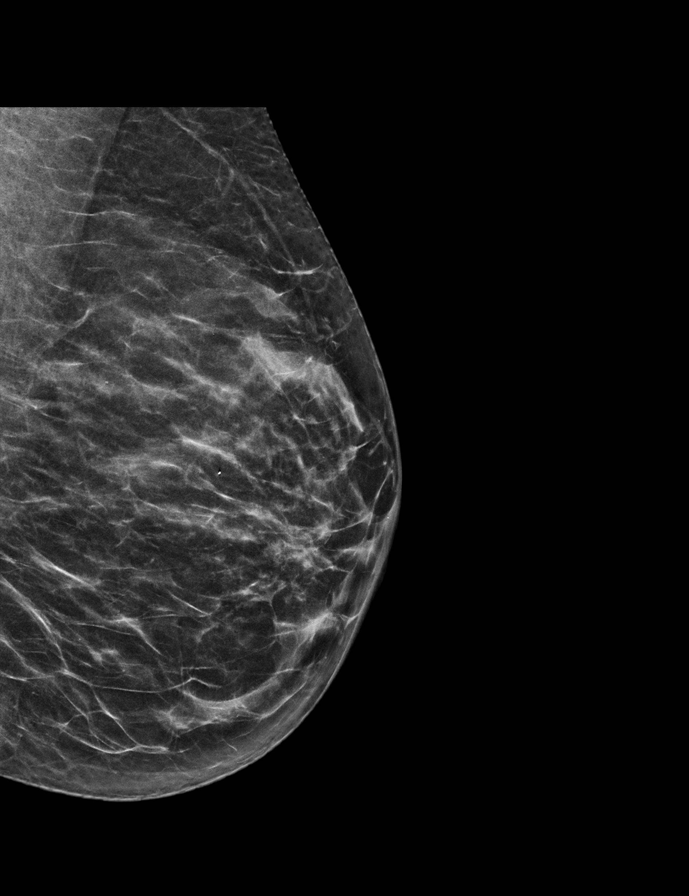

[R MLO synth-2D]
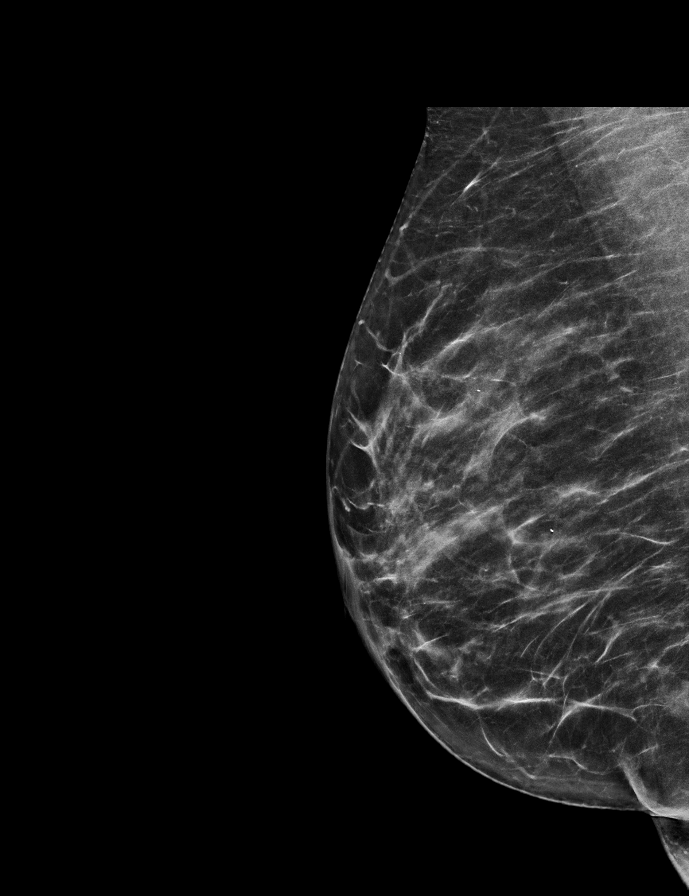

[L CC synth-2D]
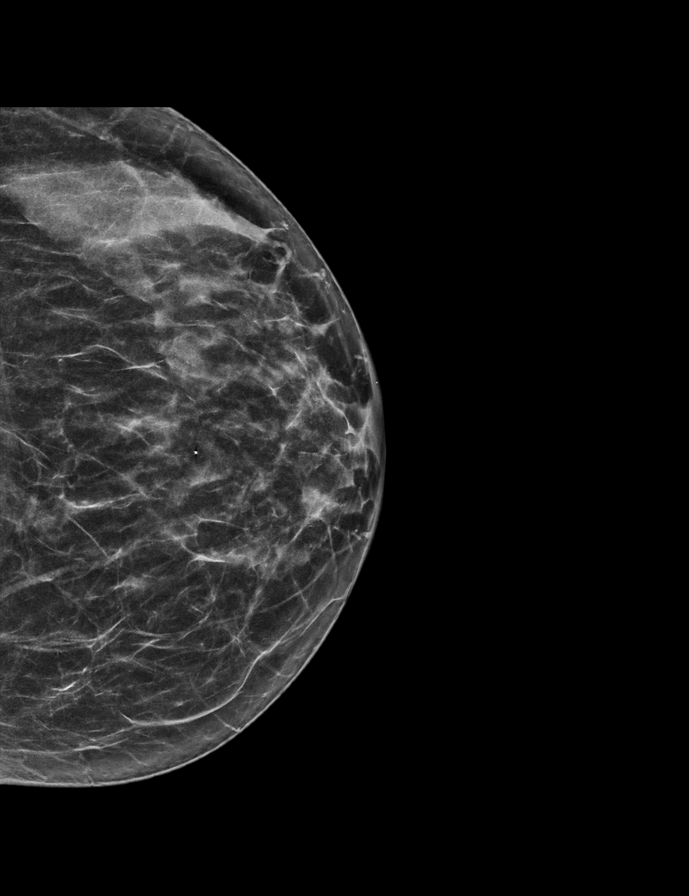

[R CC synth-2D]
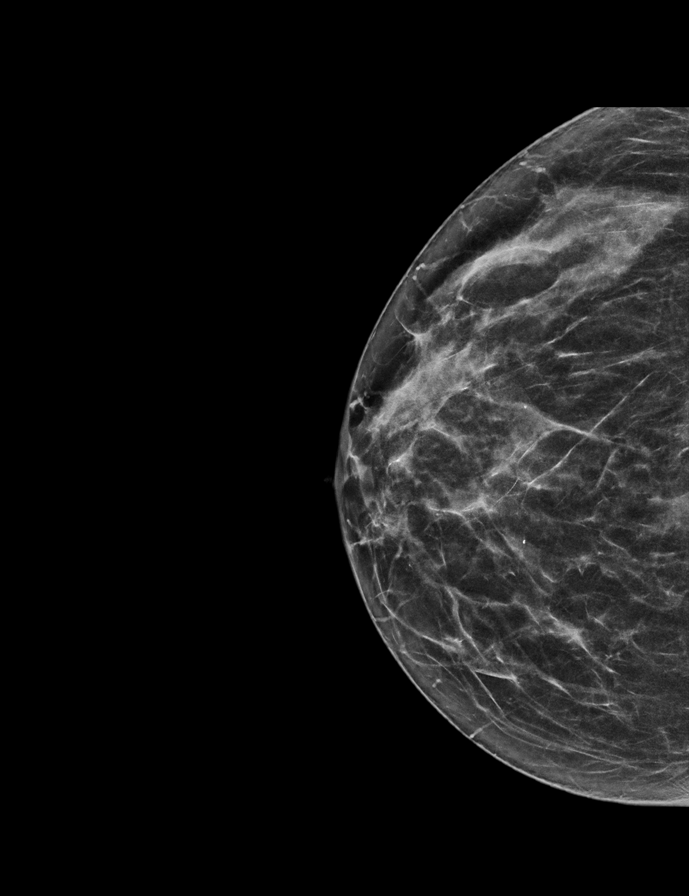

[L CC tomo · 2 of 54 frames shown]
[frame 18/54]
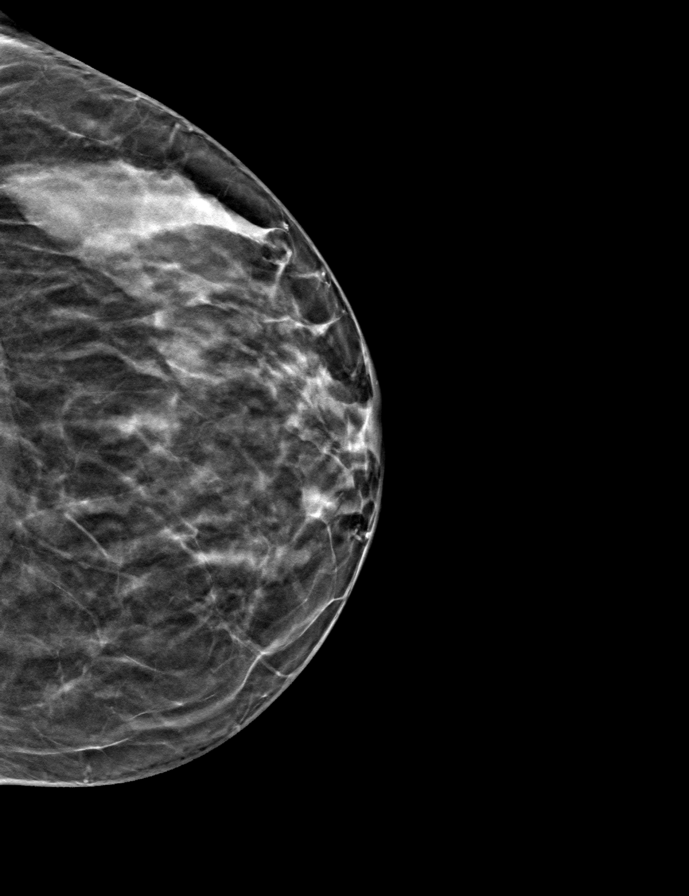
[frame 27/54]
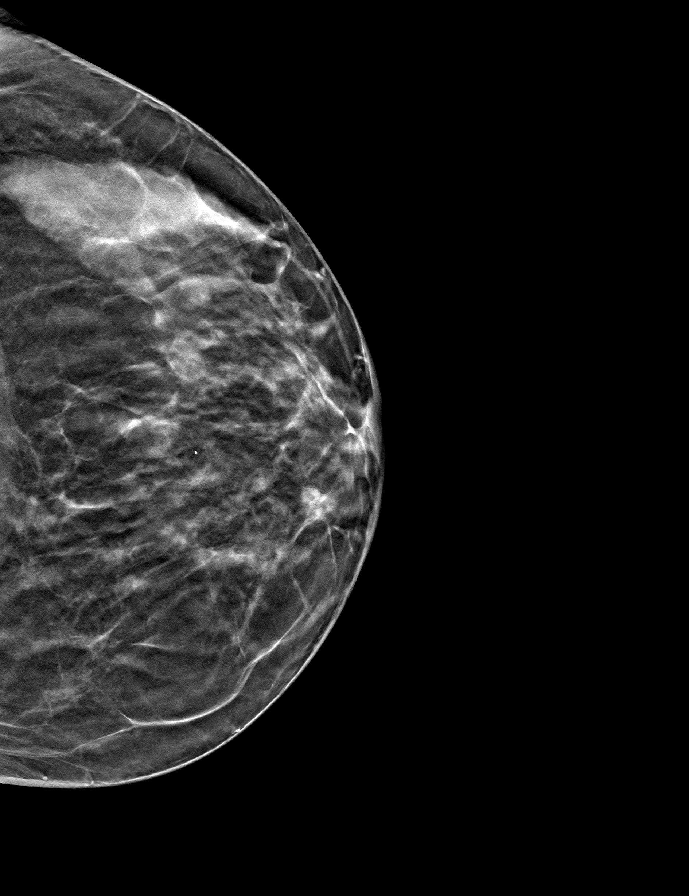

[L MLO tomo · tomo slice 27/54.0]
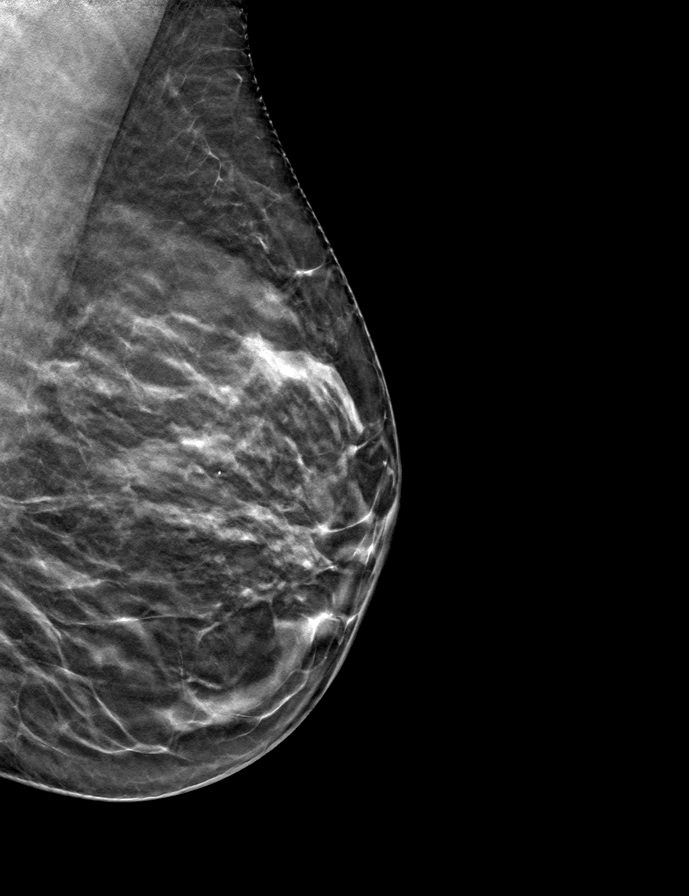

[R CC tomo · tomo slice 29/56.0]
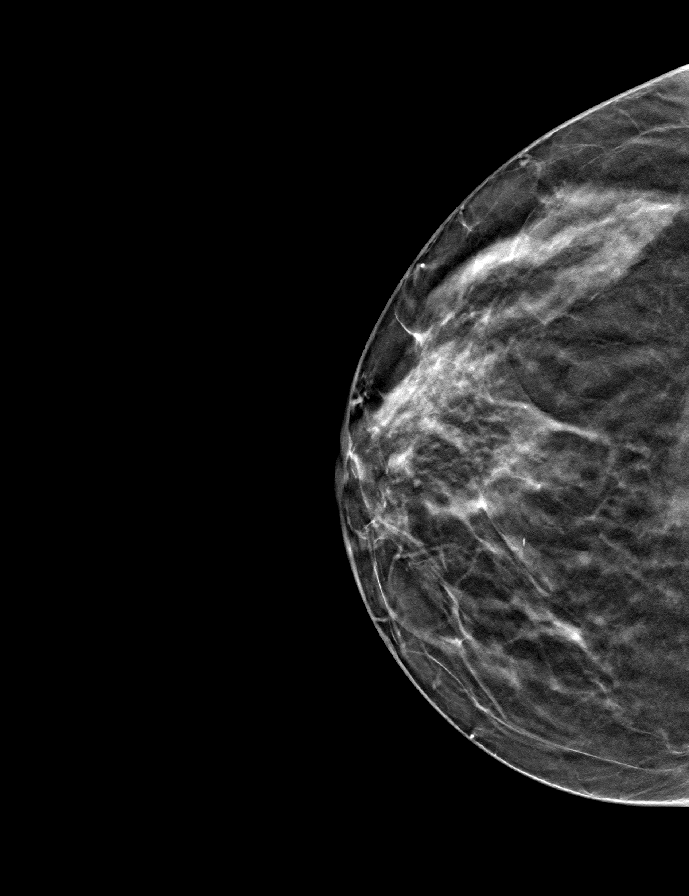

[R MLO tomo · tomo slice 29/57.0]
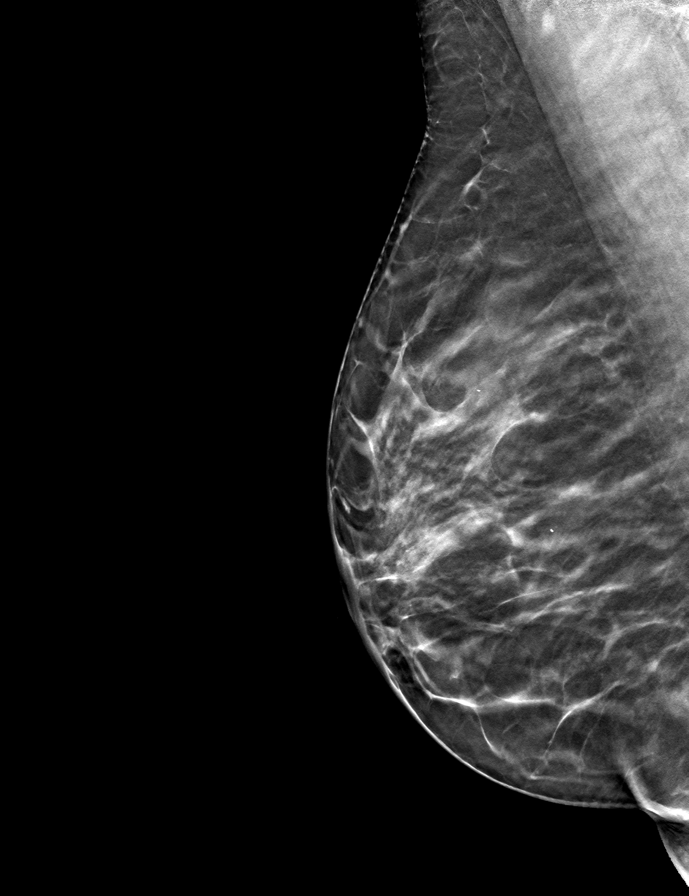

[9 of 24 positions shown; findings below may reference images not displayed]

ACR Breast Density Category c: The breast tissue is heterogeneously
dense, which may obscure small masses.
FINDINGS: There are no findings suspicious for malignancy.
IMPRESSION: No mammographic evidence of malignancy. A result letter of this
screening mammogram will be mailed directly to the patient.

RECOMMENDATION:
Screening mammogram in one year. (Code:Q3-W-BC3)

BI-RADS CATEGORY  1: Negative.

## 2023-01-16 DIAGNOSIS — Z23 Encounter for immunization: Secondary | ICD-10-CM | POA: Diagnosis not present

## 2023-01-16 DIAGNOSIS — S61451A Open bite of right hand, initial encounter: Secondary | ICD-10-CM | POA: Diagnosis not present

## 2023-09-21 ENCOUNTER — Other Ambulatory Visit: Payer: Self-pay | Admitting: Gynecologic Oncology

## 2023-09-21 DIAGNOSIS — Z1231 Encounter for screening mammogram for malignant neoplasm of breast: Secondary | ICD-10-CM

## 2023-09-23 ENCOUNTER — Inpatient Hospital Stay
Admission: RE | Admit: 2023-09-23 | Discharge: 2023-09-23 | Discharge status: Home / Self Care | Source: Ambulatory Visit | Attending: Gynecologic Oncology | Admitting: Gynecologic Oncology

## 2023-09-23 DIAGNOSIS — Z1231 Encounter for screening mammogram for malignant neoplasm of breast: Secondary | ICD-10-CM

## 2024-01-07 ENCOUNTER — Ambulatory Visit: Admitting: Emergency Medicine

## 2024-01-07 ENCOUNTER — Encounter: Payer: Self-pay | Admitting: Emergency Medicine

## 2024-01-07 VITALS — BP 118/64 | HR 86 | Temp 98.6°F | Ht 64.0 in | Wt 157.0 lb

## 2024-01-07 DIAGNOSIS — K529 Noninfective gastroenteritis and colitis, unspecified: Secondary | ICD-10-CM

## 2024-01-07 NOTE — Patient Instructions (Signed)
 Diarrhea, Adult Diarrhea is when you pass loose and sometimes watery poop (stool) often. Diarrhea can make you feel weak and cause you to lose water in your body (get dehydrated). Losing water in your body can cause you to: Feel tired and thirsty. Have a dry mouth. Go pee (urinate) less often. Diarrhea often lasts 2-3 days. It can last longer if it is a sign of something more serious. Be sure to treat your diarrhea as told by your doctor. Follow these instructions at home: Eating and drinking     Follow these instructions as told by your doctor: Take an ORS (oral rehydration solution). This is a drink that helps you replace fluids and minerals your body lost. It is sold at pharmacies and stores. Drink enough fluid to keep your pee (urine) pale yellow. Drink fluids such as: Water. You can also get fluids by sucking on ice chips. Diluted fruit juice. Low-calorie sports drinks. Milk. Avoid drinking fluids that have a lot of sugar or caffeine in them. These include soda, energy drinks, and regular sports drinks. Avoid alcohol. Eat bland, easy-to-digest foods in small amounts as you are able. These foods include: Bananas. Applesauce. Rice. Low-fat (lean) meats. Toast. Crackers. Avoid spicy or fatty foods.  Medicines Take over-the-counter and prescription medicines only as told by your doctor. If you were prescribed antibiotics, take them as told by your doctor. Do not stop taking them even if you start to feel better. General instructions  Wash your hands often using soap and water for 20 seconds. If soap and water are not available, use hand sanitizer. Others in your home should wash their hands as well. Wash your hands: After using the toilet or changing a diaper. Before preparing, cooking, or serving food. While caring for a sick person. While visiting someone in a hospital. Rest at home while you get better. Take a warm bath to help with any burning or pain from having  diarrhea. Watch your condition for any changes. Contact a doctor if: You have a fever. Your diarrhea gets worse. You have new symptoms. You vomit every time you eat or drink. You feel light-headed, dizzy, or you have a headache. You have muscle cramps. You have signs of losing too much water in your body, such as: Dark pee, very little pee, or no pee. Cracked lips. Dry mouth. Sunken eyes. Sleepiness. Weakness. You have bloody or black poop or poop that looks like tar. You have very bad pain, cramping, or bloating in your belly (abdomen). Your skin feels cold and clammy. You feel confused. Get help right away if: You have chest pain. Your heart is beating very quickly. You have trouble breathing or you are breathing very quickly. You feel very weak or you faint. These symptoms may be an emergency. Get help right away. Call 911. Do not wait to see if the symptoms will go away. Do not drive yourself to the hospital. This information is not intended to replace advice given to you by your health care provider. Make sure you discuss any questions you have with your health care provider. Document Revised: 08/27/2021 Document Reviewed: 08/27/2021 Elsevier Patient Education  2024 [employer].

## 2024-01-07 NOTE — Assessment & Plan Note (Signed)
 Clinically stable.  No red flag signs or symptoms Running its course without complications Feeling much better at present time No longer vomiting.  Still has some diarrhea. Diet and nutrition discussed.  Recommend BRAT diet for 24 hours. Over-the-counter Imodium as needed. Advised to contact the office if no better or worse during the next several days.

## 2024-01-07 NOTE — Progress Notes (Signed)
 Stephanie Johnson 53 y.o.   Chief Complaint  Patient presents with   Diarrhea    Patient states having nausea this morning and diarrhea, vomited once this morning, patient states having blood and mucus in her stool now.  Patient states not taking anything to help. States she does not feel bad.     HISTORY OF PRESENT ILLNESS: This is a 53 y.o. 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  Had food from [employer] that evening. No other complaints or medical concerns today.  Diarrhea  Associated symptoms include abdominal pain and vomiting. Pertinent negatives include no chills, fever or headaches.     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 cervical cancer s/p 12'14 cone biopsy   Carcinoma in situ of cervix 03/08/2013   Family history of breast cancer    Family history of colon cancer    Family history of prostate cancer    Stomach problems    stomach sensitive freq    Past Surgical History:  Procedure Laterality Date   CERVICAL CONIZATION W/BX N/A 03/08/2013   Procedure: CONIZATION CERVIX WITH BIOPSY ;  Surgeon: Krystal Deaner, MD;  Location: [facility];  Service: Gynecology;  Laterality: N/A;  1hr OR time   childbirth     x 3 NVD   DILATION AND CURETTAGE OF UTERUS     ROBOTIC  ASSISTED TOTAL HYSTERECTOMY WITH BILATERAL SALPINGO OOPHERECTOMY N/A 04/19/2013   Procedure: ROBOTIC ASSISTED RADICAL HYSTERECTOMY WITH BILATERAL SALPINGECTOMY AND PELVIC LYMPH NODE DISSECTION;  Surgeon: Elenore A. Dodie, MD;  Location: [facility];  Service: Gynecology;  Laterality: N/A;    Social History   Socioeconomic History   Marital status: Married    Spouse name: Not on file   Number of children: Not on file   Years of education: Not on file   Highest education level: Not on file  Occupational History   Not on file  Tobacco Use   Smoking status: Never   Smokeless tobacco: Never  Vaping Use   Vaping status: Never Used  Substance and Sexual Activity   Alcohol use: Yes    Comment: rare social   Drug use: No   Sexual activity: Yes  Other Topics Concern   Not on file  Social History Narrative   Not on file   Social Drivers of Health   Financial Resource Strain: Not on file  Food Insecurity: Not on file  Transportation Needs: Not on file  Physical Activity: Not on file  Stress: Not on file  Social Connections: Not on file  Intimate Partner Violence: Not on file    Family History  Problem Relation Age of Onset   Breast cancer Mother 8  stage IV, had for 30 years (Sybil Larimore)   Hypertension Father    Stroke Father    Prostate cancer Father    Diabetes Daughter 6       DM I   Prostate cancer Maternal Uncle    Dementia Paternal Aunt    Breast cancer Maternal Grandmother 34       recur 28s   Leukemia Maternal Grandfather    Cancer Paternal Grandmother        unknown   Heart failure Paternal Grandfather 40       congestive   Colon cancer Cousin 40   Prostate cancer Brother 52   Migraines Brother    Endometrial cancer Neg Hx    Ovarian cancer Neg Hx      Review of Systems  Constitutional: Negative.  Negative for chills and fever.  HENT: Negative.  Negative for congestion and sore throat.   Cardiovascular: Negative.  Negative for chest pain and  palpitations.  Gastrointestinal:  Positive for abdominal pain, blood in stool, diarrhea, nausea and vomiting.  Genitourinary: Negative.   Skin: Negative.  Negative for rash.  Neurological:  Negative for dizziness and headaches.  All other systems reviewed and are negative.   Vitals:   01/07/24 1508  BP: 118/64  Pulse: 86  Temp: 98.6 F (37 C)  SpO2: 96%    Physical Exam Vitals reviewed.  Constitutional:      Appearance: Normal appearance.  HENT:     Head: Normocephalic.     Mouth/Throat:     Mouth: Mucous membranes are moist.     Pharynx: Oropharynx is clear.  Eyes:     Extraocular Movements: Extraocular movements intact.     Pupils: Pupils are equal, round, and reactive to light.  Cardiovascular:     Rate and Rhythm: Normal rate and regular rhythm.     Pulses: Normal pulses.     Heart sounds: Normal heart sounds.  Pulmonary:     Effort: Pulmonary effort is normal.     Breath sounds: Normal breath sounds.  Abdominal:     Palpations: Abdomen is soft.     Tenderness: There is no abdominal tenderness.  Musculoskeletal:     Cervical back: No tenderness.  Lymphadenopathy:     Cervical: No cervical adenopathy.  Skin:    General: Skin is warm and dry.     Capillary Refill: Capillary refill takes less than 2 seconds.  Neurological:     General: No focal deficit present.     Mental Status: She is alert and oriented to person, place, and time.  Psychiatric:        Mood and Affect: Mood normal.        Behavior: Behavior normal.      ASSESSMENT & PLAN: I personally spent a total of 32 minutes minutes in the care of the patient today including preparing to see the patient, getting/reviewing separately obtained history, performing a medically appropriate exam/evaluation, counseling and educating, documenting clinical information in the EHR, and coordinating care.  Problem List Items Addressed This Visit       Digestive   Acute gastroenteritis - Primary   Clinically  stable.  No red flag signs or symptoms Running its course without complications Feeling much better at present time No longer vomiting.  Still has some diarrhea. Diet and nutrition discussed.  Recommend BRAT diet for 24 hours. Over-the-counter Imodium as needed. Advised to contact the office if no better or worse during the next several days.  Patient Instructions  Diarrhea, Adult Diarrhea is when you pass loose and sometimes watery poop (stool) often. Diarrhea can make you feel weak and cause you to lose water  in your body (get dehydrated). Losing water  in your body can cause you to: Feel tired and thirsty. Have a dry mouth. Go pee (urinate) less often. Diarrhea often lasts 2-3 days. It can last longer if it is a sign of something more serious. Be sure to treat your diarrhea as told by your doctor. Follow these instructions at home: Eating and drinking     Follow these instructions as told by your doctor: Take an ORS (oral rehydration solution). This is a drink that helps you replace fluids and minerals your body lost. It is sold at pharmacies and stores. Drink enough fluid to keep your pee (urine) pale yellow. Drink fluids such as: Water . You can also get fluids by sucking on ice chips. Diluted fruit juice. Low-calorie sports drinks. Milk. Avoid drinking fluids that have a lot of sugar or caffeine in them. These include soda, energy drinks, and regular sports drinks. Avoid alcohol. Eat bland, easy-to-digest foods in small amounts as you are able. These foods include: Bananas. Applesauce. Rice. Low-fat (lean) meats. Toast. Crackers. Avoid spicy or fatty foods.  Medicines Take over-the-counter and prescription medicines only as told by your doctor. If you were prescribed antibiotics, take them as told by your doctor. Do not stop taking them even if you start to feel better. General instructions  Wash your hands often using soap and water  for 20 seconds. If soap and  water  are not available, use hand sanitizer. Others in your home should wash their hands as well. Wash your hands: After using the toilet or changing a diaper. Before preparing, cooking, or serving food. While caring for a sick person. While visiting someone in a hospital. Rest at home while you get better. Take a warm bath to help with any burning or pain from having diarrhea. Watch your condition for any changes. Contact a doctor if: You have a fever. Your diarrhea gets worse. You have new symptoms. You vomit every time you eat or drink. You feel light-headed, dizzy, or you have a headache. You have muscle cramps. You have signs of losing too much water  in your body, such as: Dark pee, very little pee, or no pee. Cracked lips. Dry mouth. Sunken eyes. Sleepiness. Weakness. You have bloody or black poop or poop that looks like tar. You have very bad pain, cramping, or bloating in your belly (abdomen). Your skin feels cold and clammy. You feel confused. Get help right away if: You have chest pain. Your heart is beating very quickly. You have trouble breathing or you are breathing very quickly. You feel very weak or you faint. These symptoms may be an emergency. Get help right away. Call 911. Do not wait to see if the symptoms will go away. Do not drive yourself to the hospital. This information is not intended to replace advice given to you by your health care provider. Make sure you discuss any questions you have with your health care provider. Document Revised: 08/27/2021 Document Reviewed: 08/27/2021 [employer] Patient Education  2024 [employer]   Emil Schaumann, MD [facility]
